# Patient Record
Sex: Female | Born: 1960
Health system: Southern US, Community
[De-identification: ages and names within clinical notes are randomized; demographics above are authoritative.]

## PROBLEM LIST (undated history)

## (undated) DIAGNOSIS — I10 Essential (primary) hypertension: Secondary | ICD-10-CM

## (undated) DIAGNOSIS — E669 Obesity, unspecified: Secondary | ICD-10-CM

## (undated) DIAGNOSIS — Z8619 Personal history of other infectious and parasitic diseases: Secondary | ICD-10-CM

## (undated) DIAGNOSIS — D649 Anemia, unspecified: Secondary | ICD-10-CM

## (undated) DIAGNOSIS — E785 Hyperlipidemia, unspecified: Secondary | ICD-10-CM

## (undated) HISTORY — DX: Personal history of other infectious and parasitic diseases: Z86.19

## (undated) HISTORY — PX: OTHER SURGICAL HISTORY: SHX169

## (undated) HISTORY — DX: Obesity, unspecified: E66.9

## (undated) HISTORY — DX: Anemia, unspecified: D64.9

## (undated) HISTORY — DX: Hyperlipidemia, unspecified: E78.5

## (undated) HISTORY — PX: COLONOSCOPY: SHX174

## (undated) HISTORY — DX: Essential (primary) hypertension: I10

---

## 2002-06-12 ENCOUNTER — Ambulatory Visit (HOSPITAL_COMMUNITY): Admission: RE | Admit: 2002-06-12 | Discharge: 2002-06-12 | Payer: Self-pay | Admitting: *Deleted

## 2003-06-15 ENCOUNTER — Ambulatory Visit (HOSPITAL_COMMUNITY): Admission: RE | Admit: 2003-06-15 | Discharge: 2003-06-15 | Payer: Self-pay | Admitting: *Deleted

## 2003-06-15 ENCOUNTER — Encounter: Payer: Self-pay | Admitting: *Deleted

## 2004-06-16 ENCOUNTER — Ambulatory Visit (HOSPITAL_COMMUNITY): Admission: RE | Admit: 2004-06-16 | Discharge: 2004-06-16 | Payer: Self-pay | Admitting: Family Medicine

## 2004-08-12 ENCOUNTER — Ambulatory Visit: Payer: Self-pay | Admitting: *Deleted

## 2005-03-14 ENCOUNTER — Ambulatory Visit: Payer: Self-pay | Admitting: Family Medicine

## 2005-04-26 ENCOUNTER — Ambulatory Visit: Payer: Self-pay | Admitting: Family Medicine

## 2005-06-19 ENCOUNTER — Ambulatory Visit (HOSPITAL_COMMUNITY): Admission: RE | Admit: 2005-06-19 | Discharge: 2005-06-19 | Payer: Self-pay | Admitting: Family Medicine

## 2005-06-29 ENCOUNTER — Encounter: Admission: RE | Admit: 2005-06-29 | Discharge: 2005-06-29 | Payer: Self-pay | Admitting: Family Medicine

## 2005-09-11 ENCOUNTER — Ambulatory Visit: Payer: Self-pay | Admitting: Family Medicine

## 2006-06-06 ENCOUNTER — Ambulatory Visit: Payer: Self-pay | Admitting: Family Medicine

## 2006-07-06 ENCOUNTER — Ambulatory Visit (HOSPITAL_COMMUNITY): Admission: RE | Admit: 2006-07-06 | Discharge: 2006-07-06 | Payer: Self-pay | Admitting: Family Medicine

## 2007-07-10 ENCOUNTER — Ambulatory Visit (HOSPITAL_COMMUNITY): Admission: RE | Admit: 2007-07-10 | Discharge: 2007-07-10 | Payer: Self-pay | Admitting: Family Medicine

## 2007-08-07 ENCOUNTER — Encounter (INDEPENDENT_AMBULATORY_CARE_PROVIDER_SITE_OTHER): Payer: Self-pay | Admitting: *Deleted

## 2007-09-17 ENCOUNTER — Ambulatory Visit: Payer: Self-pay | Admitting: Internal Medicine

## 2007-09-17 ENCOUNTER — Encounter: Payer: Self-pay | Admitting: Family Medicine

## 2007-09-17 LAB — CONVERTED CEMR LAB
BUN: 15 mg/dL (ref 6–23)
Basophils Relative: 0 % (ref 0–1)
CO2: 21 meq/L (ref 19–32)
Calcium: 10 mg/dL (ref 8.4–10.5)
Chlamydia, DNA Probe: NEGATIVE
Chloride: 108 meq/L (ref 96–112)
Cholesterol: 207 mg/dL — ABNORMAL HIGH (ref 0–200)
Creatinine, Ser: 0.89 mg/dL (ref 0.40–1.20)
Eosinophils Relative: 1 % (ref 0–5)
HCT: 33.5 % — ABNORMAL LOW (ref 36.0–46.0)
HDL: 72 mg/dL (ref >39)
Hemoglobin: 10.6 g/dL — ABNORMAL LOW (ref 12.0–15.0)
MCHC: 31.6 g/dL (ref 30.0–36.0)
Monocytes Absolute: 0.3 10*3/uL (ref 0.2–0.7)
Monocytes Relative: 6 % (ref 3–11)
Neutro Abs: 2.8 10*3/uL (ref 1.7–7.7)
RBC: 3.75 M/uL — ABNORMAL LOW (ref 3.87–5.11)
RDW: 12.9 % (ref 11.5–14.0)
Total Bilirubin: 0.5 mg/dL (ref 0.3–1.2)
Total CHOL/HDL Ratio: 2.9
VLDL: 19 mg/dL (ref 0–40)

## 2007-09-23 ENCOUNTER — Ambulatory Visit: Payer: Self-pay | Admitting: Internal Medicine

## 2007-11-08 ENCOUNTER — Ambulatory Visit: Payer: Self-pay | Admitting: Internal Medicine

## 2007-12-30 ENCOUNTER — Ambulatory Visit: Payer: Self-pay | Admitting: Family Medicine

## 2007-12-30 ENCOUNTER — Encounter (INDEPENDENT_AMBULATORY_CARE_PROVIDER_SITE_OTHER): Payer: Self-pay | Admitting: Internal Medicine

## 2007-12-30 LAB — CONVERTED CEMR LAB
ALT: 12 units/L (ref 0–35)
AST: 13 units/L (ref 0–37)
Alkaline Phosphatase: 48 units/L (ref 39–117)
Basophils Absolute: 0 10*3/uL (ref 0.0–0.1)
Basophils Relative: 0 % (ref 0–1)
Eosinophils Absolute: 0.1 10*3/uL (ref 0.0–0.7)
Eosinophils Relative: 1 % (ref 0–5)
Glucose, Bld: 88 mg/dL (ref 70–99)
HCT: 35.7 % — ABNORMAL LOW (ref 36.0–46.0)
LDL Cholesterol: 60 mg/dL (ref 0–99)
MCHC: 30.3 g/dL (ref 30.0–36.0)
MCV: 92.2 fL (ref 78.0–100.0)
Platelets: 273 10*3/uL (ref 150–400)
RDW: 13.7 % (ref 11.5–15.5)
Sodium: 139 meq/L (ref 135–145)
Total Bilirubin: 0.5 mg/dL (ref 0.3–1.2)
Total Protein: 7.9 g/dL (ref 6.0–8.3)
Triglycerides: 141 mg/dL (ref <150)
VLDL: 28 mg/dL (ref 0–40)

## 2008-03-16 ENCOUNTER — Ambulatory Visit: Payer: Self-pay | Admitting: Family Medicine

## 2008-07-21 ENCOUNTER — Ambulatory Visit (HOSPITAL_COMMUNITY): Admission: RE | Admit: 2008-07-21 | Discharge: 2008-07-21 | Payer: Self-pay | Admitting: Family Medicine

## 2008-09-14 ENCOUNTER — Encounter (INDEPENDENT_AMBULATORY_CARE_PROVIDER_SITE_OTHER): Payer: Self-pay | Admitting: Adult Health

## 2008-09-14 ENCOUNTER — Ambulatory Visit: Payer: Self-pay | Admitting: Internal Medicine

## 2008-09-14 LAB — CONVERTED CEMR LAB
AST: 11 units/L (ref 0–37)
Albumin: 5.2 g/dL (ref 3.5–5.2)
Alkaline Phosphatase: 46 units/L (ref 39–117)
Basophils Relative: 0 % (ref 0–1)
Chlamydia, DNA Probe: NEGATIVE
Eosinophils Absolute: 0 10*3/uL (ref 0.0–0.7)
LDL Cholesterol: 69 mg/dL (ref 0–99)
Lymphs Abs: 2.5 10*3/uL (ref 0.7–4.0)
MCV: 88 fL (ref 78.0–100.0)
Microalb, Ur: 2.85 mg/dL — ABNORMAL HIGH (ref 0.00–1.89)
Neutrophils Relative %: 50 % (ref 43–77)
Platelets: 260 10*3/uL (ref 150–400)
Potassium: 3.1 meq/L — ABNORMAL LOW (ref 3.5–5.3)
Sodium: 140 meq/L (ref 135–145)
Total Protein: 8.4 g/dL — ABNORMAL HIGH (ref 6.0–8.3)
WBC: 5.7 10*3/uL (ref 4.0–10.5)

## 2008-09-15 ENCOUNTER — Encounter (INDEPENDENT_AMBULATORY_CARE_PROVIDER_SITE_OTHER): Payer: Self-pay | Admitting: Adult Health

## 2008-12-18 ENCOUNTER — Ambulatory Visit: Payer: Self-pay | Admitting: Internal Medicine

## 2009-01-08 ENCOUNTER — Ambulatory Visit: Payer: Self-pay | Admitting: Internal Medicine

## 2009-01-08 ENCOUNTER — Encounter (INDEPENDENT_AMBULATORY_CARE_PROVIDER_SITE_OTHER): Payer: Self-pay | Admitting: Adult Health

## 2009-01-08 LAB — CONVERTED CEMR LAB
ALT: 14 units/L (ref 0–35)
Albumin: 4.6 g/dL (ref 3.5–5.2)
Alkaline Phosphatase: 53 units/L (ref 39–117)
CO2: 23 meq/L (ref 19–32)
Eosinophils Absolute: 0.1 10*3/uL (ref 0.0–0.7)
Glucose, Bld: 105 mg/dL — ABNORMAL HIGH (ref 70–99)
LDL Cholesterol: 79 mg/dL (ref 0–99)
Lymphocytes Relative: 50 % — ABNORMAL HIGH (ref 12–46)
Lymphs Abs: 3.2 10*3/uL (ref 0.7–4.0)
Neutrophils Relative %: 41 % — ABNORMAL LOW (ref 43–77)
Platelets: 269 10*3/uL (ref 150–400)
Potassium: 3.7 meq/L (ref 3.5–5.3)
Sodium: 142 meq/L (ref 135–145)
Total Protein: 7.2 g/dL (ref 6.0–8.3)
Triglycerides: 65 mg/dL (ref <150)
WBC: 6.3 10*3/uL (ref 4.0–10.5)

## 2009-07-13 ENCOUNTER — Ambulatory Visit: Payer: Self-pay | Admitting: Internal Medicine

## 2009-07-22 ENCOUNTER — Ambulatory Visit (HOSPITAL_COMMUNITY): Admission: RE | Admit: 2009-07-22 | Discharge: 2009-07-22 | Payer: Self-pay | Admitting: Internal Medicine

## 2009-07-28 ENCOUNTER — Ambulatory Visit: Payer: Self-pay | Admitting: Internal Medicine

## 2009-07-29 ENCOUNTER — Encounter (INDEPENDENT_AMBULATORY_CARE_PROVIDER_SITE_OTHER): Payer: Self-pay | Admitting: *Deleted

## 2009-07-29 LAB — CONVERTED CEMR LAB
ALT: 11 units/L (ref 0–35)
Albumin: 4.3 g/dL (ref 3.5–5.2)
Basophils Relative: 0 % (ref 0–1)
CO2: 24 meq/L (ref 19–32)
Chloride: 105 meq/L (ref 96–112)
Cholesterol: 159 mg/dL (ref 0–200)
Glucose, Bld: 95 mg/dL (ref 70–99)
Hemoglobin: 10.2 g/dL — ABNORMAL LOW (ref 12.0–15.0)
Lymphocytes Relative: 47 % — ABNORMAL HIGH (ref 12–46)
Lymphs Abs: 3.6 10*3/uL (ref 0.7–4.0)
MCHC: 30.3 g/dL (ref 30.0–36.0)
Monocytes Absolute: 0.6 10*3/uL (ref 0.1–1.0)
Monocytes Relative: 8 % (ref 3–12)
Neutro Abs: 3.4 10*3/uL (ref 1.7–7.7)
Potassium: 3.5 meq/L (ref 3.5–5.3)
RBC: 3.69 M/uL — ABNORMAL LOW (ref 3.87–5.11)
Sodium: 142 meq/L (ref 135–145)
Total Protein: 7.3 g/dL (ref 6.0–8.3)
Triglycerides: 111 mg/dL (ref <150)
WBC: 7.7 10*3/uL (ref 4.0–10.5)

## 2009-08-02 ENCOUNTER — Encounter (INDEPENDENT_AMBULATORY_CARE_PROVIDER_SITE_OTHER): Payer: Self-pay | Admitting: Adult Health

## 2009-08-02 LAB — CONVERTED CEMR LAB
Folate: 9.6 ng/mL
Iron: 97 ug/dL (ref 42–145)
TIBC: 340 ug/dL (ref 250–470)
UIBC: 243 ug/dL
Vitamin B-12: 403 pg/mL (ref 211–911)

## 2009-10-01 ENCOUNTER — Ambulatory Visit: Payer: Self-pay | Admitting: Internal Medicine

## 2009-10-01 ENCOUNTER — Encounter (INDEPENDENT_AMBULATORY_CARE_PROVIDER_SITE_OTHER): Payer: Self-pay | Admitting: Adult Health

## 2009-10-01 LAB — CONVERTED CEMR LAB
Albumin: 5 g/dL (ref 3.5–5.2)
BUN: 22 mg/dL (ref 6–23)
Calcium: 10.3 mg/dL (ref 8.4–10.5)
Chloride: 103 meq/L (ref 96–112)
Creatinine, Ser: 1.1 mg/dL (ref 0.40–1.20)
Eosinophils Absolute: 0 10*3/uL (ref 0.0–0.7)
Eosinophils Relative: 1 % (ref 0–5)
Glucose, Bld: 102 mg/dL — ABNORMAL HIGH (ref 70–99)
HCT: 34.4 % — ABNORMAL LOW (ref 36.0–46.0)
Hemoglobin: 10.5 g/dL — ABNORMAL LOW (ref 12.0–15.0)
Lymphocytes Relative: 39 % (ref 12–46)
Lymphs Abs: 2.5 10*3/uL (ref 0.7–4.0)
MCV: 91.2 fL (ref 78.0–100.0)
Monocytes Absolute: 0.6 10*3/uL (ref 0.1–1.0)
Platelets: 227 10*3/uL (ref 150–400)
Potassium: 3.5 meq/L (ref 3.5–5.3)
RDW: 13.1 % (ref 11.5–15.5)
Vit D, 25-Hydroxy: 7 ng/mL — ABNORMAL LOW (ref 30–89)
WBC: 6.3 10*3/uL (ref 4.0–10.5)

## 2009-10-12 ENCOUNTER — Ambulatory Visit: Payer: Self-pay | Admitting: Internal Medicine

## 2010-01-11 ENCOUNTER — Ambulatory Visit: Payer: Self-pay | Admitting: Internal Medicine

## 2010-02-14 ENCOUNTER — Ambulatory Visit: Payer: Self-pay | Admitting: Internal Medicine

## 2010-04-07 ENCOUNTER — Encounter (INDEPENDENT_AMBULATORY_CARE_PROVIDER_SITE_OTHER): Payer: Self-pay | Admitting: Adult Health

## 2010-04-07 ENCOUNTER — Ambulatory Visit: Payer: Self-pay | Admitting: Internal Medicine

## 2010-04-07 LAB — CONVERTED CEMR LAB
Albumin: 4.8 g/dL (ref 3.5–5.2)
Alkaline Phosphatase: 49 units/L (ref 39–117)
BUN: 28 mg/dL — ABNORMAL HIGH (ref 6–23)
Basophils Absolute: 0 10*3/uL (ref 0.0–0.1)
CO2: 21 meq/L (ref 19–32)
Calcium: 9.6 mg/dL (ref 8.4–10.5)
Chloride: 103 meq/L (ref 96–112)
Eosinophils Relative: 1 % (ref 0–5)
Glucose, Bld: 96 mg/dL (ref 70–99)
HCT: 35.5 % — ABNORMAL LOW (ref 36.0–46.0)
Hemoglobin: 10.9 g/dL — ABNORMAL LOW (ref 12.0–15.0)
LDL Cholesterol: 72 mg/dL (ref 0–99)
Lymphocytes Relative: 44 % (ref 12–46)
Lymphs Abs: 3.2 10*3/uL (ref 0.7–4.0)
Monocytes Absolute: 0.6 10*3/uL (ref 0.1–1.0)
Monocytes Relative: 8 % (ref 3–12)
Neutro Abs: 3.4 10*3/uL (ref 1.7–7.7)
Potassium: 3.3 meq/L — ABNORMAL LOW (ref 3.5–5.3)
RBC: 3.91 M/uL (ref 3.87–5.11)
Sodium: 139 meq/L (ref 135–145)
Total Protein: 7.9 g/dL (ref 6.0–8.3)
Triglycerides: 98 mg/dL (ref <150)
WBC: 7.3 10*3/uL (ref 4.0–10.5)

## 2010-06-14 ENCOUNTER — Ambulatory Visit: Payer: Self-pay | Admitting: Internal Medicine

## 2010-06-14 ENCOUNTER — Encounter (INDEPENDENT_AMBULATORY_CARE_PROVIDER_SITE_OTHER): Payer: Self-pay | Admitting: Adult Health

## 2010-06-14 LAB — CONVERTED CEMR LAB
BUN: 22 mg/dL (ref 6–23)
CO2: 22 meq/L (ref 19–32)
Chloride: 103 meq/L (ref 96–112)
Creatinine, Ser: 1.18 mg/dL (ref 0.40–1.20)
Eosinophils Relative: 1 % (ref 0–5)
HCT: 34.4 % — ABNORMAL LOW (ref 36.0–46.0)
Hemoglobin: 10.9 g/dL — ABNORMAL LOW (ref 12.0–15.0)
Lymphocytes Relative: 45 % (ref 12–46)
MCHC: 31.7 g/dL (ref 30.0–36.0)
Monocytes Absolute: 0.4 10*3/uL (ref 0.1–1.0)
Monocytes Relative: 6 % (ref 3–12)
Neutro Abs: 3.2 10*3/uL (ref 1.7–7.7)
RBC: 3.87 M/uL (ref 3.87–5.11)
Vit D, 25-Hydroxy: 23 ng/mL — ABNORMAL LOW (ref 30–89)

## 2010-06-20 ENCOUNTER — Encounter (INDEPENDENT_AMBULATORY_CARE_PROVIDER_SITE_OTHER): Payer: Self-pay | Admitting: Adult Health

## 2010-07-26 ENCOUNTER — Ambulatory Visit (HOSPITAL_COMMUNITY): Admission: RE | Admit: 2010-07-26 | Discharge: 2010-07-26 | Payer: Self-pay | Admitting: Internal Medicine

## 2010-08-02 ENCOUNTER — Encounter: Admission: RE | Admit: 2010-08-02 | Discharge: 2010-08-02 | Payer: Self-pay | Admitting: Internal Medicine

## 2011-07-03 ENCOUNTER — Other Ambulatory Visit (HOSPITAL_COMMUNITY): Payer: Self-pay | Admitting: Family Medicine

## 2011-07-03 DIAGNOSIS — Z1231 Encounter for screening mammogram for malignant neoplasm of breast: Secondary | ICD-10-CM

## 2011-08-04 ENCOUNTER — Ambulatory Visit (HOSPITAL_COMMUNITY)
Admission: RE | Admit: 2011-08-04 | Discharge: 2011-08-04 | Disposition: A | Discharge status: Home / Self Care | Payer: Self-pay | Source: Ambulatory Visit | Attending: Family Medicine | Admitting: Family Medicine

## 2011-08-04 DIAGNOSIS — Z1231 Encounter for screening mammogram for malignant neoplasm of breast: Secondary | ICD-10-CM | POA: Insufficient documentation

## 2012-04-19 ENCOUNTER — Telehealth: Payer: Self-pay | Admitting: Internal Medicine

## 2012-04-19 NOTE — Telephone Encounter (Signed)
s/w pt and she is aware of her appt   aom  °

## 2012-04-22 ENCOUNTER — Telehealth: Payer: Self-pay | Admitting: Internal Medicine

## 2012-04-22 NOTE — Telephone Encounter (Signed)
Referred by Dr. Norberto Sorenson Dx- Chronic Anemia

## 2012-04-26 ENCOUNTER — Encounter: Payer: Self-pay | Admitting: Medical Oncology

## 2012-04-29 ENCOUNTER — Other Ambulatory Visit (HOSPITAL_BASED_OUTPATIENT_CLINIC_OR_DEPARTMENT_OTHER): Payer: Self-pay | Admitting: Lab

## 2012-04-29 ENCOUNTER — Ambulatory Visit (HOSPITAL_BASED_OUTPATIENT_CLINIC_OR_DEPARTMENT_OTHER): Payer: Self-pay | Admitting: Internal Medicine

## 2012-04-29 ENCOUNTER — Telehealth: Payer: Self-pay | Admitting: Internal Medicine

## 2012-04-29 ENCOUNTER — Encounter: Payer: Self-pay | Admitting: Internal Medicine

## 2012-04-29 ENCOUNTER — Ambulatory Visit: Payer: Self-pay

## 2012-04-29 VITALS — BP 173/69 | HR 92 | Temp 97.4°F | Ht 64.5 in | Wt 164.9 lb

## 2012-04-29 DIAGNOSIS — E785 Hyperlipidemia, unspecified: Secondary | ICD-10-CM

## 2012-04-29 DIAGNOSIS — D649 Anemia, unspecified: Secondary | ICD-10-CM

## 2012-04-29 DIAGNOSIS — E669 Obesity, unspecified: Secondary | ICD-10-CM

## 2012-04-29 DIAGNOSIS — I10 Essential (primary) hypertension: Secondary | ICD-10-CM

## 2012-04-29 LAB — CBC & DIFF AND RETIC
Eosinophils Absolute: 0 10*3/uL (ref 0.0–0.5)
LYMPH%: 50.1 % — ABNORMAL HIGH (ref 14.0–49.7)
MCHC: 32.5 g/dL (ref 31.5–36.0)
MCV: 88.6 fL (ref 79.5–101.0)
MONO%: 5.3 % (ref 0.0–14.0)
NEUT#: 2.8 10*3/uL (ref 1.5–6.5)
NEUT%: 43.8 % (ref 38.4–76.8)
Platelets: 171 10*3/uL (ref 145–400)
RBC: 3.51 10*6/uL — ABNORMAL LOW (ref 3.70–5.45)
Retic %: 2.59 % — ABNORMAL HIGH (ref 0.70–2.10)

## 2012-04-29 NOTE — Telephone Encounter (Signed)
Gv pt appt for july2013 °

## 2012-04-29 NOTE — Progress Notes (Signed)
Patient came in today as a new patient she has no insurance she has an orange card through [place].I gave her an Medicaid application and Epp application to fill out and return to me.

## 2012-04-29 NOTE — Progress Notes (Signed)
[facility] [phone]   [phone]  CONSULT NOTE  REASON FOR CONSULTATION:  51 years old [place] American female with persistent anemia.  HPI Anita Ramirez is a 51 y.o. female was past medical history significant for hypertension, dyslipidemia and persistent anemia for years. The patient was seen recently by her primary care physician at the [facility]. Blood work at that time showed persistent anemia, most likely secondary to iron deficient. The patient had hemoglobin electrophoresis performed in August of 2011 that showed normal pattern. She has been on iron supplement with Fersol 325 mg by mouth daily. She is tolerating it fine with no specific adverse effects. The patient denied having any complaints today he is specifically no fatigue or dizzy spells. The patient denied having any bleeding issues, no bruises or ecchymosis. Her menstrual cycles are regular and lasting around 5 days with no clots. She was referred to me today for further evaluation and recommendation regarding her persistent anemia. The patient has no family history of sickle cell disease or other type of anemia. She is single and has no children. @SFHPI @  Past Medical History  Diagnosis Date  . Hypertension   . Obesity   . Hyperlipemia   . Anemia     No past surgical history on file.  No family history on file.  Social History History  Substance Use Topics  . Smoking status: Not on file  . Smokeless tobacco: Not on file  . Alcohol Use: Not on file    Allergies no known allergies  Current Outpatient Prescriptions  Medication Sig Dispense Refill  . atenolol (TENORMIN) 25 MG tablet Take 25 mg by mouth daily.      . ferrous sulfate 325 (65 FE) MG tablet Take 325 mg by mouth 2 (two) times daily.      Marland Kitchen lisinopril-hydrochlorothiazide (PRINZIDE,ZESTORETIC) 20-25 MG per tablet Take 1 tablet by mouth daily.      . pravastatin (PRAVACHOL) 40 MG tablet Take 40 mg by  mouth at bedtime.        Review of Systems  A comprehensive review of systems was negative.  Physical Exam  GNF:AOZHY, healthy, no distress, well nourished and well developed SKIN: skin color, texture, turgor are normal HEAD: Normocephalic, No masses, lesions, tenderness or abnormalities EYES: normal EARS: External ears normal OROPHARYNX:no exudate and no erythema  NECK: supple, no adenopathy LYMPH:  no palpable lymphadenopathy, no hepatosplenomegaly BREAST:not examined LUNGS: clear to auscultation  HEART: regular rate & rhythm, no murmurs and no gallops ABDOMEN:abdomen soft, non-tender, normal bowel sounds and no masses or organomegaly BACK: Back symmetric, no curvature. EXTREMITIES:no joint deformities, effusion, or inflammation, no edema, no skin discoloration, no clubbing, no cyanosis  NEURO: alert & oriented x 3 with fluent speech, no focal motor/sensory deficits, gait normal  PERFORMANCE STATUS: ECOG 0  Studies/Results: No results found.   ASSESSMENT: This is a very pleasant 51 years old [place] American female with persistent anemia most likely secondary to iron deficiency.  PLAN: I have a lengthy discussion with the patient today about her current disease and evaluation for her persistent anemia. I recommended several studies today including repeat CBC, comprehensive metabolic panel, LDH, iron study, ferritin, serum B12, serum folate and serum protein electrophoreses. These studies are still pending. I recommended for the patient to continue on oral iron supplement but to take it twice a day. I would see the patient back for followup visit in 4 weeks for evaluation and discussion of her lab  results and further recommendations regarding treatment of her condition. If no response to the oral iron tablet, I may consider the patient for intravenous iron infusion. The patient agreed to the current plan.  All questions were answered. The patient knows to call the clinic  with any problems, questions or concerns. We can certainly see the patient much sooner if necessary.  Thank you so much for allowing me to participate in the care of Anita Ramirez. I will continue to follow up the patient with you and assist in her care.  I spent 25 minutes counseling the patient face to face. The total time spent in the appointment was 50 minutes.  Leocadia Idleman K. 04/29/2012, 10:48 AM

## 2012-05-01 LAB — PROTEIN ELECTROPHORESIS, SERUM
Albumin ELP: 61.6 % (ref 55.8–66.1)
Alpha-1-Globulin: 4 % (ref 2.9–4.9)
Alpha-2-Globulin: 9.6 % (ref 7.1–11.8)
Beta 2: 4.8 % (ref 3.2–6.5)
Beta Globulin: 6 % (ref 4.7–7.2)
Gamma Globulin: 14 % (ref 11.1–18.8)
Total Protein, Serum Electrophoresis: 6.5 g/dL (ref 6.0–8.3)

## 2012-05-01 LAB — IRON AND TIBC
%SAT: 44 % (ref 20–55)
Iron: 142 ug/dL (ref 42–145)
TIBC: 326 ug/dL (ref 250–470)
UIBC: 184 ug/dL (ref 125–400)

## 2012-05-01 LAB — FERRITIN: Ferritin: 1228 ng/mL — ABNORMAL HIGH (ref 10–291)

## 2012-05-01 LAB — COMPREHENSIVE METABOLIC PANEL
ALT: 14 U/L (ref 0–35)
AST: 14 U/L (ref 0–37)
Albumin: 4.1 g/dL (ref 3.5–5.2)
Alkaline Phosphatase: 36 U/L — ABNORMAL LOW (ref 39–117)
BUN: 14 mg/dL (ref 6–23)
Calcium: 9.5 mg/dL (ref 8.4–10.5)
Chloride: 112 mEq/L (ref 96–112)
Creatinine, Ser: 1.06 mg/dL (ref 0.50–1.10)
Potassium: 3.8 mEq/L (ref 3.5–5.3)

## 2012-05-01 LAB — ERYTHROPOIETIN: Erythropoietin: 46 m[IU]/mL — ABNORMAL HIGH (ref 2.6–34.0)

## 2012-05-01 LAB — FOLATE: Folate: 7.5 ng/mL

## 2012-05-27 ENCOUNTER — Other Ambulatory Visit (HOSPITAL_BASED_OUTPATIENT_CLINIC_OR_DEPARTMENT_OTHER): Payer: Self-pay | Admitting: Lab

## 2012-05-27 ENCOUNTER — Ambulatory Visit (HOSPITAL_BASED_OUTPATIENT_CLINIC_OR_DEPARTMENT_OTHER): Payer: Self-pay | Admitting: Internal Medicine

## 2012-05-27 ENCOUNTER — Encounter: Payer: Self-pay | Admitting: Internal Medicine

## 2012-05-27 ENCOUNTER — Telehealth: Payer: Self-pay | Admitting: Internal Medicine

## 2012-05-27 VITALS — BP 134/85 | HR 91 | Temp 99.0°F | Ht 64.5 in | Wt 159.7 lb

## 2012-05-27 DIAGNOSIS — D509 Iron deficiency anemia, unspecified: Secondary | ICD-10-CM

## 2012-05-27 DIAGNOSIS — D649 Anemia, unspecified: Secondary | ICD-10-CM

## 2012-05-27 LAB — CBC WITH DIFFERENTIAL/PLATELET
Basophils Absolute: 0 10*3/uL (ref 0.0–0.1)
Eosinophils Absolute: 0 10*3/uL (ref 0.0–0.5)
HCT: 33.7 % — ABNORMAL LOW (ref 34.8–46.6)
LYMPH%: 46.5 % (ref 14.0–49.7)
MCV: 90 fL (ref 79.5–101.0)
MONO%: 8 % (ref 0.0–14.0)
NEUT#: 2.9 10*3/uL (ref 1.5–6.5)
NEUT%: 44.6 % (ref 38.4–76.8)
Platelets: 205 10*3/uL (ref 145–400)
RBC: 3.75 10*6/uL (ref 3.70–5.45)

## 2012-05-27 NOTE — Progress Notes (Signed)
Patient stop by my office today after her doctor visit to return her epp application and the medicaid application.I gave her the medicaid application back ,because she was missing her Id,Social security Card,Birth Certificate,and proof of Residency.I told her once she get all of that information together to take it down there to [employer] Social Service,her boy friend say he know where its located.

## 2012-05-27 NOTE — Progress Notes (Signed)
[facility] [phone]   Fax:([phone]  OFFICE PROGRESS NOTE   DIAGNOSIS: Iron deficiency anemia  PRIOR THERAPY: None  CURRENT THERAPY: Ferrous sulfate 325 mg by mouth twice a day   INTERVAL HISTORY: Anita Ramirez 51 y.o. female returns to the clinic today for followup visit accompanied by her husband. The patient is doing fine today with no specific complaints. She denied having any fatigue or weakness. No dizzy spells. She is tolerating her treatment with oral iron tablet well. The patient has several studies for evaluation of her anemia including erythropoietin levels that was 46.0, iron was normal at 142, total iron binding capacity 326 with iron saturation of 44% and ferritin level of 1228. Serum folate was normal at 7.5, serum vitamin B12 was normal at 213 and serum protein electrophoreses showed no detectable M spike.   MEDICAL HISTORY: Past Medical History  Diagnosis Date  . Hypertension   . Obesity   . Hyperlipemia   . Anemia     ALLERGIES:   has no known allergies.  MEDICATIONS:  Current Outpatient Prescriptions  Medication Sig Dispense Refill  . atenolol (TENORMIN) 25 MG tablet Take 25 mg by mouth daily.      . ferrous sulfate 325 (65 FE) MG tablet Take 325 mg by mouth 2 (two) times daily.      Marland Kitchen lisinopril-hydrochlorothiazide (PRINZIDE,ZESTORETIC) 20-25 MG per tablet Take 1 tablet by mouth daily.        REVIEW OF SYSTEMS:  A comprehensive review of systems was negative.   PHYSICAL EXAMINATION: General appearance: alert, cooperative and no distress Neck: no adenopathy Lymph nodes: Cervical, supraclavicular, and axillary nodes normal. Resp: clear to auscultation bilaterally Cardio: regular rate and rhythm, S1, S2 normal, no murmur, click, rub or gallop GI: soft, non-tender; bowel sounds normal; no masses,  no organomegaly Extremities: extremities normal, atraumatic, no cyanosis or edema  ECOG PERFORMANCE STATUS: 0 -  Asymptomatic  Blood pressure 134/85, pulse 91, temperature 99 F (37.2 C), temperature source Oral, height 5' 4.5" (1.638 m), weight 159 lb 11.2 oz (72.439 kg).  LABORATORY DATA: Lab Results  Component Value Date   WBC 6.5 05/27/2012   HGB 11.0* 05/27/2012   HCT 33.7* 05/27/2012   MCV 90.0 05/27/2012   PLT 205 05/27/2012      Chemistry      Component Value Date/Time   NA 145 04/29/2012 0938   K 3.8 04/29/2012 0938   CL 112 04/29/2012 0938   CO2 24 04/29/2012 0938   BUN 14 04/29/2012 0938   CREATININE 1.06 04/29/2012 0938      Component Value Date/Time   CALCIUM 9.5 04/29/2012 0938   ALKPHOS 36* 04/29/2012 0938   AST 14 04/29/2012 0938   ALT 14 04/29/2012 0938   BILITOT 0.4 04/29/2012 0938       RADIOGRAPHIC STUDIES: No results found.  ASSESSMENT: This is a very pleasant 51 years old [place] American female with iron deficiency anemia currently on oral iron tablets. The patient is doing fine and there is improvement in her hemoglobin and hematocrit since her last visit.  PLAN: I discussed the lab result with the patient and her husband and recommend for her to continue his oral iron tablet for now. I would see her back for followup visit in 3 months with repeat CBC, iron study and ferritin. She was advised to call me immediately if she has any concerning symptoms in the interval.  All questions were answered. The  patient knows to call the clinic with any problems, questions or concerns. We can certainly see the patient much sooner if necessary.

## 2012-05-27 NOTE — Telephone Encounter (Signed)
appts made and printed for pt aom °

## 2012-06-03 ENCOUNTER — Encounter: Payer: Self-pay | Admitting: Internal Medicine

## 2012-06-03 NOTE — Progress Notes (Signed)
Patient approve for a 100% Discount for a family of one,income 3,480.00  06/03/12 - 12/04/12.

## 2012-08-13 ENCOUNTER — Ambulatory Visit (HOSPITAL_COMMUNITY): Payer: Self-pay

## 2012-08-20 ENCOUNTER — Other Ambulatory Visit: Payer: Self-pay | Admitting: Obstetrics and Gynecology

## 2012-08-20 DIAGNOSIS — Z1231 Encounter for screening mammogram for malignant neoplasm of breast: Secondary | ICD-10-CM

## 2012-08-21 ENCOUNTER — Encounter (HOSPITAL_COMMUNITY): Payer: Self-pay | Admitting: *Deleted

## 2012-08-27 ENCOUNTER — Ambulatory Visit (HOSPITAL_COMMUNITY)
Admission: RE | Admit: 2012-08-27 | Discharge: 2012-08-27 | Disposition: A | Discharge status: Home / Self Care | Payer: Self-pay | Source: Ambulatory Visit | Attending: Obstetrics and Gynecology | Admitting: Obstetrics and Gynecology

## 2012-08-27 ENCOUNTER — Other Ambulatory Visit: Payer: Self-pay | Admitting: Obstetrics and Gynecology

## 2012-08-27 ENCOUNTER — Other Ambulatory Visit (HOSPITAL_COMMUNITY)
Admission: RE | Admit: 2012-08-27 | Discharge: 2012-08-27 | Disposition: A | Discharge status: Home / Self Care | Payer: Self-pay | Source: Ambulatory Visit | Attending: Obstetrics and Gynecology | Admitting: Obstetrics and Gynecology

## 2012-08-27 ENCOUNTER — Encounter (HOSPITAL_COMMUNITY): Payer: Self-pay

## 2012-08-27 ENCOUNTER — Ambulatory Visit: Payer: Self-pay

## 2012-08-27 DIAGNOSIS — Z1231 Encounter for screening mammogram for malignant neoplasm of breast: Secondary | ICD-10-CM

## 2012-08-27 DIAGNOSIS — Z01419 Encounter for gynecological examination (general) (routine) without abnormal findings: Secondary | ICD-10-CM | POA: Insufficient documentation

## 2012-08-27 LAB — POCT PREGNANCY, URINE: Preg Test, Ur: NEGATIVE

## 2012-08-27 LAB — HM PAP SMEAR

## 2012-08-28 ENCOUNTER — Ambulatory Visit: Payer: Self-pay | Admitting: Internal Medicine

## 2012-08-28 ENCOUNTER — Other Ambulatory Visit: Payer: Self-pay | Admitting: Lab

## 2012-09-09 NOTE — Progress Notes (Signed)
Detailed notes scanned into EPIC under media. 

## 2012-09-09 NOTE — Patient Instructions (Signed)
Detailed notes scanned into EPIC under media. 

## 2012-09-12 ENCOUNTER — Telehealth (HOSPITAL_COMMUNITY): Payer: Self-pay | Admitting: *Deleted

## 2012-09-12 NOTE — Telephone Encounter (Signed)
Telephoned patient and left message to return call to [facility] 

## 2012-09-20 ENCOUNTER — Other Ambulatory Visit: Payer: Self-pay | Admitting: *Deleted

## 2012-09-20 ENCOUNTER — Telehealth (HOSPITAL_COMMUNITY): Payer: Self-pay | Admitting: *Deleted

## 2012-09-20 DIAGNOSIS — A599 Trichomoniasis, unspecified: Secondary | ICD-10-CM

## 2012-09-20 MED ORDER — METRONIDAZOLE 250 MG PO TABS: 500.0000 mg | ORAL_TABLET | Freq: Two times a day (BID) | ORAL | Status: DC

## 2012-09-20 NOTE — Telephone Encounter (Signed)
Telephoned patient at home # and discussed results of pap smear and mammogram. Advised patient that pap smear showed trichomonas and that she needed to be treated. Med called in to [facility] on [address]. Patient voiced understanding.

## 2012-09-22 IMAGING — MG MM DIGITAL SCREENING {WH}
5 series · 5 of 5 positions shown · non-contrast
Comparison: none

DG SCREEN MAMMOGRAM BILATERAL
Bilateral CC and MLO view(s) were taken.
Technologist: Madh, Aligert.(JHULIANO)(M)

DIGITAL SCREENING MAMMOGRAM WITH CAD:
The breast tissue is heterogeneously dense.  No masses or malignant type calcifications are 
identified.  Compared with prior studies.
Images were processed with CAD.

[R CC]
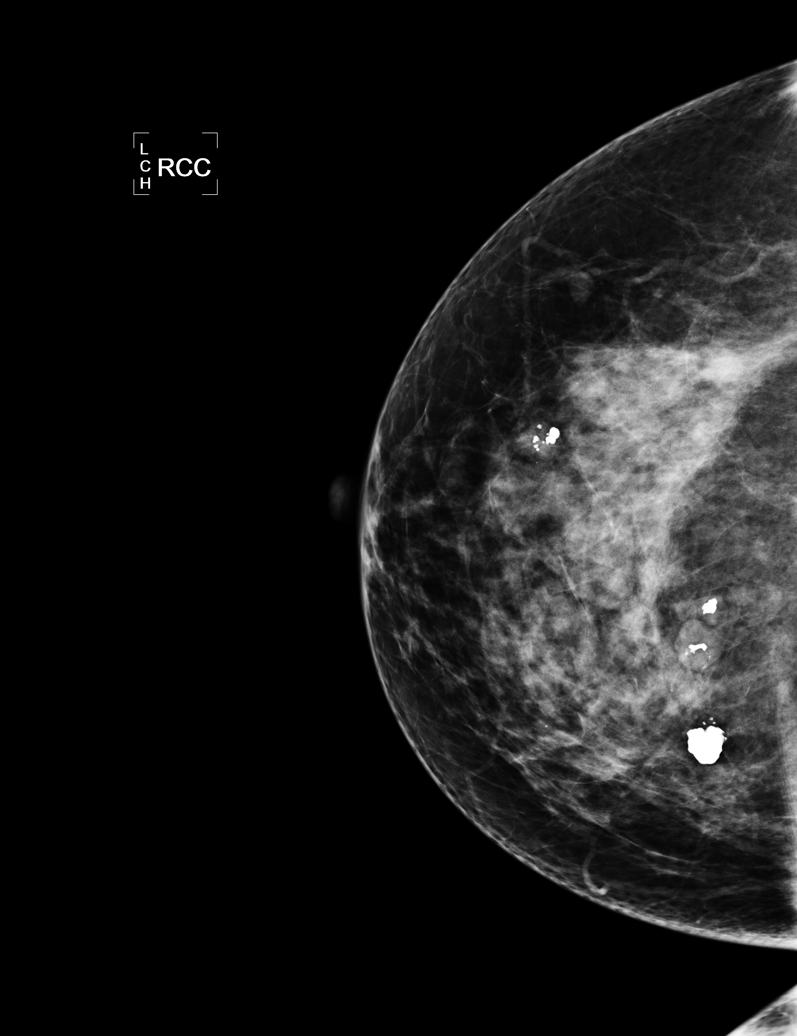

[R MLO (1 of 2)]
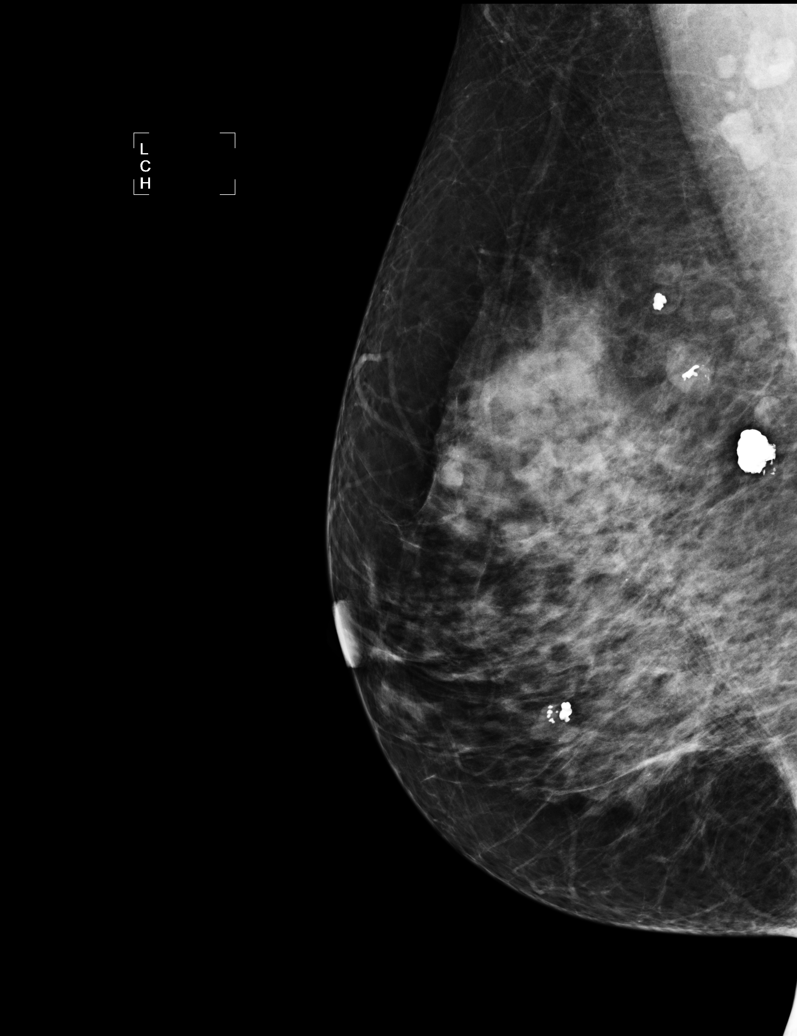

[L CC]
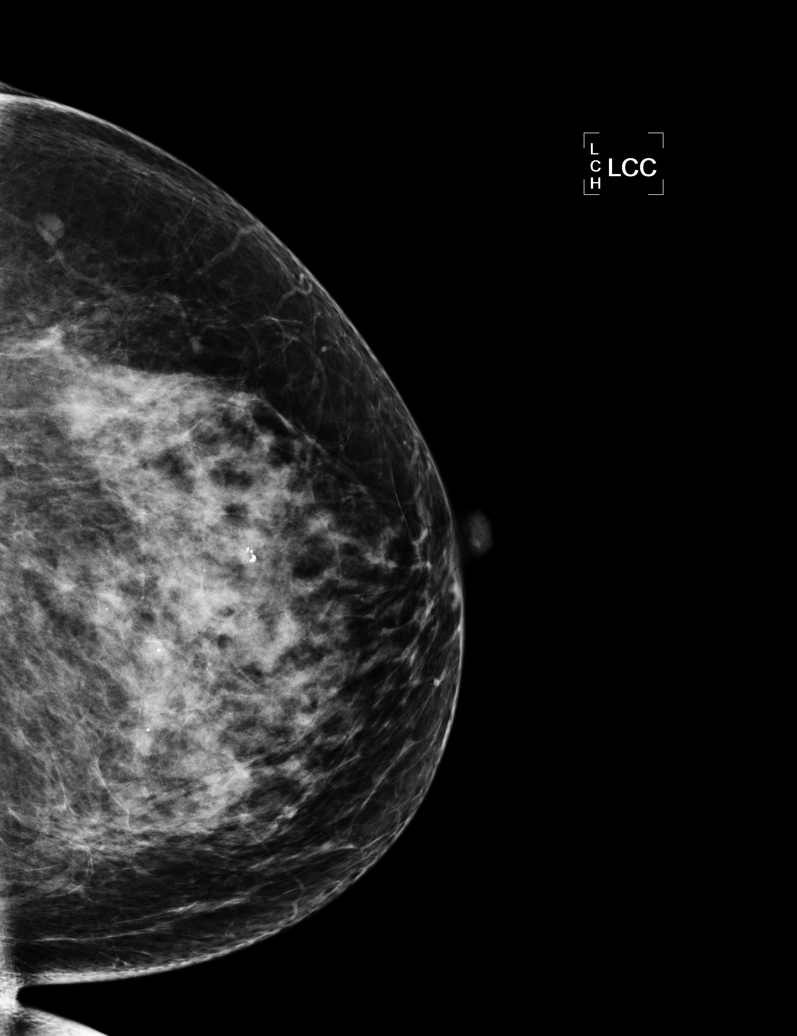

[L MLO]
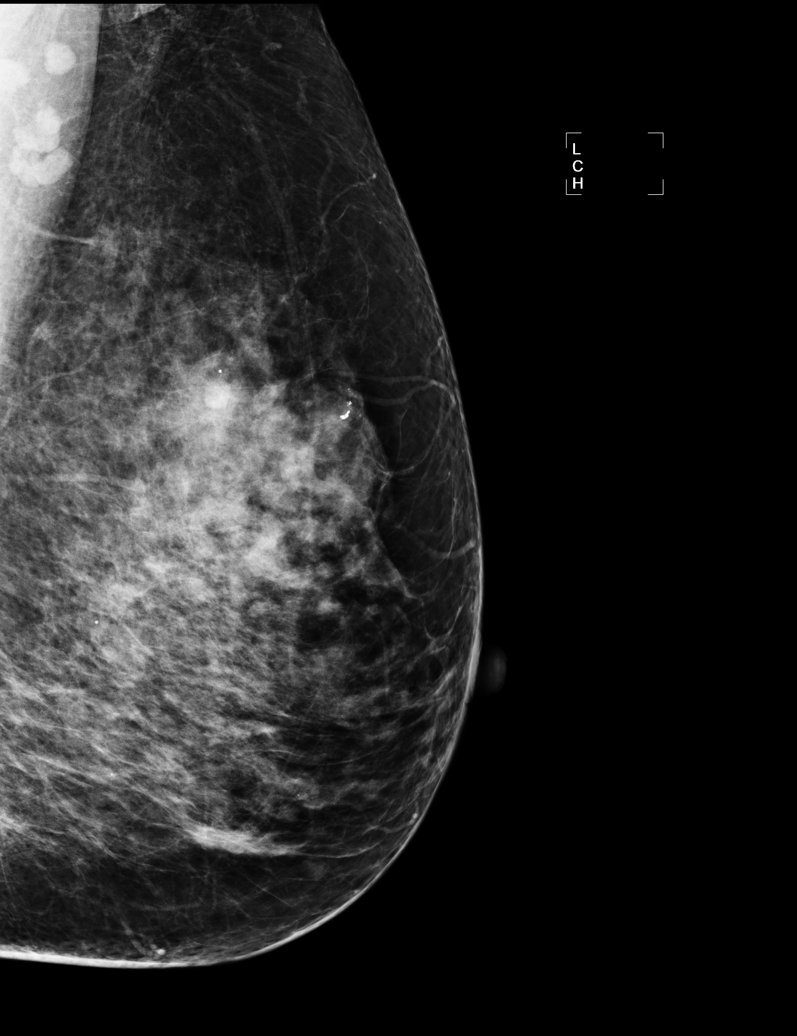

[R MLO (2 of 2)]
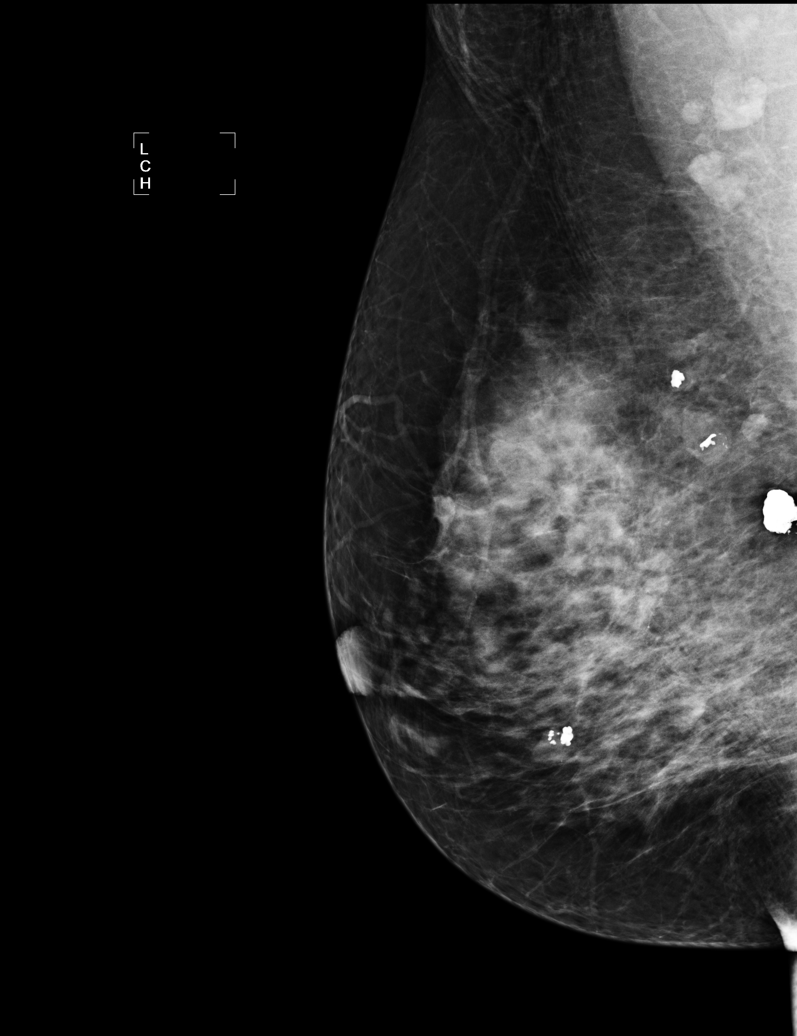

[5 of 5 positions shown; findings below may reference images not displayed]

IMPRESSION: No specific mammographic evidence of malignancy.  Next screening mammogram is recommended in one 
year.

A result letter of this screening mammogram will be mailed directly to the patient.

ASSESSMENT: Negative - BI-RADS 1

Screening mammogram in 1 year.
,

## 2012-10-30 NOTE — Addendum Note (Signed)
Encounter addended by: Saintclair Halsted, RN on: 10/30/2012  2:52 PM<BR>     Documentation filed: Visit Diagnoses, Charges VN

## 2012-11-21 NOTE — Addendum Note (Signed)
Encounter addended by: Saintclair Halsted, RN on: 11/21/2012  4:04 PM<BR>     Documentation filed: Visit Diagnoses

## 2012-11-21 NOTE — Addendum Note (Signed)
Encounter addended by: Saintclair Halsted, RN on: 11/21/2012  3:24 PM<BR>     Documentation filed: Visit Diagnoses

## 2013-05-08 ENCOUNTER — Encounter: Payer: Self-pay | Admitting: Internal Medicine

## 2013-05-08 ENCOUNTER — Ambulatory Visit (INDEPENDENT_AMBULATORY_CARE_PROVIDER_SITE_OTHER): Payer: BC Managed Care – PPO | Admitting: Internal Medicine

## 2013-05-08 ENCOUNTER — Other Ambulatory Visit (INDEPENDENT_AMBULATORY_CARE_PROVIDER_SITE_OTHER): Payer: BC Managed Care – PPO

## 2013-05-08 VITALS — BP 138/88 | HR 88 | Temp 97.7°F | Ht 61.0 in | Wt 169.0 lb

## 2013-05-08 DIAGNOSIS — Z130 Encounter for screening for diseases of the blood and blood-forming organs and certain disorders involving the immune mechanism: Secondary | ICD-10-CM

## 2013-05-08 DIAGNOSIS — Z1329 Encounter for screening for other suspected endocrine disorder: Secondary | ICD-10-CM

## 2013-05-08 DIAGNOSIS — Z0000 Encounter for general adult medical examination without abnormal findings: Secondary | ICD-10-CM

## 2013-05-08 DIAGNOSIS — E785 Hyperlipidemia, unspecified: Secondary | ICD-10-CM

## 2013-05-08 DIAGNOSIS — I10 Essential (primary) hypertension: Secondary | ICD-10-CM

## 2013-05-08 DIAGNOSIS — Z23 Encounter for immunization: Secondary | ICD-10-CM

## 2013-05-08 DIAGNOSIS — Z131 Encounter for screening for diabetes mellitus: Secondary | ICD-10-CM

## 2013-05-08 LAB — CBC
HCT: 34.6 % — ABNORMAL LOW (ref 36.0–46.0)
Hemoglobin: 11.2 g/dL — ABNORMAL LOW (ref 12.0–15.0)
MCHC: 32.4 g/dL (ref 30.0–36.0)
RDW: 13.7 % (ref 11.5–14.6)
WBC: 5.8 10*3/uL (ref 4.5–10.5)

## 2013-05-08 LAB — BASIC METABOLIC PANEL
Calcium: 9.8 mg/dL (ref 8.4–10.5)
Creatinine, Ser: 0.9 mg/dL (ref 0.4–1.2)
Sodium: 143 mEq/L (ref 135–145)

## 2013-05-08 LAB — LIPID PANEL
Cholesterol: 247 mg/dL — ABNORMAL HIGH (ref 0–200)
HDL: 65.7 mg/dL (ref >39.00)
Triglycerides: 145 mg/dL (ref 0.0–149.0)

## 2013-05-08 LAB — LDL CHOLESTEROL, DIRECT: Direct LDL: 166.2 mg/dL

## 2013-05-08 MED ORDER — LISINOPRIL-HYDROCHLOROTHIAZIDE 20-25 MG PO TABS: 1.0000 | ORAL_TABLET | Freq: Every day | ORAL | Status: DC

## 2013-05-08 NOTE — Assessment & Plan Note (Signed)
Slightly elevated Will restart lisinopril-HCT Will d/c atenolol at this time  RTC in 3 months for BP check

## 2013-05-08 NOTE — Progress Notes (Signed)
HPI  Pt presents to the clinic today to establish care. She has not had a PCP in a number of years. She does have a history of HTN. She has been out of her meds for the past 20 days. She does not monitor her blood pressure at home. She needs refills today but other than that has no concerns.   Flu: never Tetanus: more than 10 years Pap smear: 08/2012 Mammogram: 08/2012 Colonoscopy: never LMP: 04/19/2013 Eye doctor: as needed Dentist: as needed  Past Medical History  Diagnosis Date  . Hypertension   . Obesity   . Hyperlipemia   . Anemia   . History of chicken pox     Current Outpatient Prescriptions  Medication Sig Dispense Refill  . ferrous sulfate 325 (65 FE) MG tablet Take 325 mg by mouth 2 (two) times daily.      Marland Kitchen atenolol (TENORMIN) 25 MG tablet Take 25 mg by mouth daily.      Marland Kitchen lisinopril-hydrochlorothiazide (PRINZIDE,ZESTORETIC) 20-25 MG per tablet Take 1 tablet by mouth daily.       No current facility-administered medications for this visit.    No Known Allergies  Family History  Problem Relation Age of Onset  . Hypertension Mother   . Prostate cancer Maternal Grandfather   . Cancer Neg Hx   . Diabetes Neg Hx   . Early death Neg Hx   . Stroke Neg Hx     History   Social History  . Marital Status: Single    Spouse Name: N/A    Number of Children: N/A  . Years of Education: 12   Occupational History  . Not on file.   Social History Main Topics  . Smoking status: Never Smoker   . Smokeless tobacco: Never Used  . Alcohol Use: No  . Drug Use: No  . Sexually Active: Not Currently   Other Topics Concern  . Not on file   Social History Narrative   Regular exercise-yes   Caffeine Use-no    ROS:  Constitutional: Denies fever, malaise, fatigue, headache or abrupt weight changes.  HEENT: Denies eye pain, eye redness, ear pain, ringing in the ears, wax buildup, runny nose, nasal congestion, bloody nose, or sore throat. Respiratory: Denies difficulty  breathing, shortness of breath, cough or sputum production.   Cardiovascular: Denies chest pain, chest tightness, palpitations or swelling in the hands or feet.  Gastrointestinal: Denies abdominal pain, bloating, constipation, diarrhea or blood in the stool.  GU: Denies frequency, urgency, pain with urination, blood in urine, odor or discharge. Musculoskeletal: Denies decrease in range of motion, difficulty with gait, muscle pain or joint pain and swelling.  Skin: Denies redness, rashes, lesions or ulcercations.  Neurological: Denies dizziness, difficulty with memory, difficulty with speech or problems with balance and coordination.   No other specific complaints in a complete review of systems (except as listed in HPI above).  PE:  BP 138/88  Pulse 88  Temp(Src) 97.7 F (36.5 C) (Oral)  Ht 5\' 1"  (1.549 m)  Wt 169 lb (76.658 kg)  BMI 31.95 kg/m2  SpO2 96%  LMP 04/01/2013 Wt Readings from Last 3 Encounters:  05/08/13 169 lb (76.658 kg)  05/27/12 159 lb 11.2 oz (72.439 kg)  04/29/12 164 lb 14.4 oz (74.798 kg)    General: Appears her stated age, well developed, well nourished in NAD. HEENT: Head: normal shape and size; Eyes: sclera white, no icterus, conjunctiva pink, PERRLA and EOMs intact; Ears: Tm's gray and intact, normal light  reflex; Nose: mucosa pink and moist, septum midline; Throat/Mouth: Teeth present, mucosa pink and moist, no lesions or ulcerations noted.  Neck: Normal range of motion. Neck supple, trachea midline. No massses, lumps or thyromegaly present.  Cardiovascular: Normal rate and rhythm. S1,S2 noted.  Murmur noted. No rubs or gallops noted. No JVD or BLE edema. No carotid bruits noted. Pulmonary/Chest: Normal effort and positive vesicular breath sounds. No respiratory distress. No wheezes, rales or ronchi noted.  Abdomen: Soft and nontender. Normal bowel sounds, no bruits noted. No distention or masses noted. Liver, spleen and kidneys non palpable. Musculoskeletal:  Normal range of motion. No signs of joint swelling. No difficulty with gait.  Neurological: Alert and oriented. Cranial nerves II-XII intact. Coordination normal. +DTRs bilaterally. Psychiatric: Mood and affect normal. Behavior is normal. Judgment and thought content normal.      Assessment and Plan:  Preventative Health Maintenance:  Work on diet and exercise Labs obtained today Tdap given today Will set up colonoscopy

## 2013-05-08 NOTE — Addendum Note (Signed)
Addended by: Brenton Grills C on: 05/08/2013 10:16 AM   Modules accepted: Orders

## 2013-05-08 NOTE — Patient Instructions (Signed)

## 2013-05-09 ENCOUNTER — Encounter: Payer: Self-pay | Admitting: Internal Medicine

## 2013-06-24 ENCOUNTER — Ambulatory Visit (AMBULATORY_SURGERY_CENTER): Payer: BC Managed Care – PPO | Admitting: *Deleted

## 2013-06-24 VITALS — Ht 61.0 in | Wt 170.8 lb

## 2013-06-24 DIAGNOSIS — Z1211 Encounter for screening for malignant neoplasm of colon: Secondary | ICD-10-CM

## 2013-06-24 MED ORDER — NA SULFATE-K SULFATE-MG SULF 17.5-3.13-1.6 GM/177ML PO SOLN: ORAL | Status: DC

## 2013-06-24 NOTE — Progress Notes (Signed)
No allergies to eggs or soy. No problems with anesthesia.  

## 2013-06-25 ENCOUNTER — Encounter: Payer: Self-pay | Admitting: Internal Medicine

## 2013-06-26 ENCOUNTER — Telehealth: Payer: Self-pay | Admitting: Internal Medicine

## 2013-06-26 NOTE — Telephone Encounter (Signed)
Pt called to pick up sample prep on 3rd floor.

## 2013-06-26 NOTE — Telephone Encounter (Signed)
Sample of Suprep put up front for pick up.

## 2013-07-09 ENCOUNTER — Ambulatory Visit (AMBULATORY_SURGERY_CENTER): Payer: BC Managed Care – PPO | Admitting: Internal Medicine

## 2013-07-09 ENCOUNTER — Encounter: Payer: Self-pay | Admitting: Internal Medicine

## 2013-07-09 VITALS — BP 136/73 | HR 77 | Temp 98.1°F | Resp 19 | Ht 61.0 in | Wt 170.0 lb

## 2013-07-09 DIAGNOSIS — Z1211 Encounter for screening for malignant neoplasm of colon: Secondary | ICD-10-CM

## 2013-07-09 MED ORDER — SODIUM CHLORIDE 0.9 % IV SOLN: 500.0000 mL | INTRAVENOUS | Status: DC

## 2013-07-09 NOTE — Patient Instructions (Addendum)
YOU HAD AN ENDOSCOPIC PROCEDURE TODAY AT THE [facility]: Refer to the procedure report that was given to you for any specific questions about what was found during the examination.  If the procedure report does not answer your questions, please call your gastroenterologist to clarify.  If you requested that your care partner not be given the details of your procedure findings, then the procedure report has been included in a sealed envelope for you to review at your convenience later.  YOU SHOULD EXPECT: Some feelings of bloating in the abdomen. Passage of more gas than usual.  Walking can help get rid of the air that was put into your GI tract during the procedure and reduce the bloating. If you had a lower endoscopy (such as a colonoscopy or flexible sigmoidoscopy) you may notice spotting of blood in your stool or on the toilet paper. If you underwent a bowel prep for your procedure, then you may not have a normal bowel movement for a few days.  DIET: Your first meal following the procedure should be a light meal and then it is ok to progress to your normal diet.  A half-sandwich or bowl of soup is an example of a good first meal.  Heavy or fried foods are harder to digest and may make you feel nauseous or bloated.  Likewise meals heavy in dairy and vegetables can cause extra gas to form and this can also increase the bloating.  Drink plenty of fluids but you should avoid alcoholic beverages for 24 hours.  ACTIVITY: Your care partner should take you home directly after the procedure.  You should plan to take it easy, moving slowly for the rest of the day.  You can resume normal activity the day after the procedure however you should NOT DRIVE or use heavy machinery for 24 hours (because of the sedation medicines used during the test).    SYMPTOMS TO REPORT IMMEDIATELY: A gastroenterologist can be reached at any hour.  During normal business hours, 8:30 AM to 5:00 PM Monday through Friday,  call [phone].  After hours and on weekends, please call the GI answering service at [phone] who will take a message and have the physician on call contact you.   Following lower endoscopy (colonoscopy or flexible sigmoidoscopy):  Excessive amounts of blood in the stool  Significant tenderness or worsening of abdominal pains  Swelling of the abdomen that is new, acute  Fever of 100F or higher  FOLLOW UP: If any biopsies were taken you will be contacted by phone or by letter within the next 1-3 weeks.  Call your gastroenterologist if you have not heard about the biopsies in 3 weeks.  Our staff will call the home number listed on your records the next business day following your procedure to check on you and address any questions or concerns that you may have at that time regarding the information given to you following your procedure. This is a courtesy call and so if there is no answer at the home number and we have not heard from you through the emergency physician on call, we will assume that you have returned to your regular daily activities without incident.  SIGNATURES/CONFIDENTIALITY: You and/or your care partner have signed paperwork which will be entered into your electronic medical record.  These signatures attest to the fact that that the information above on your After Visit Summary has been reviewed and is understood.  Full responsibility of the confidentiality of this   discharge information lies with you and/or your care-partner.  Resume medications. 

## 2013-07-09 NOTE — Progress Notes (Signed)
Patient did not experience any of the following events: a burn prior to discharge; a fall within the facility; wrong site/side/patient/procedure/implant event; or a hospital transfer or hospital admission upon discharge from the facility. (G8907) Patient did not have preoperative order for IV antibiotic SSI prophylaxis. (G8918)  

## 2013-07-09 NOTE — Progress Notes (Signed)
Procedure ends, to recovery, report given and VSS. 

## 2013-07-09 NOTE — Op Note (Signed)
[facility] [address] [place], [address]   COLONOSCOPY PROCEDURE REPORT  PATIENT: Anita Ramirez, Anita Ramirez  MR#: [id number] BIRTHDATE: [birth year] , 51  yrs. old GENDER: Female ENDOSCOPIST: Iva Boop, MD, [facility] REFERRED BY:   Nicki Reaper, NP PROCEDURE DATE:  07/09/2013 PROCEDURE:   Colonoscopy, screening First Screening Colonoscopy - Avg.  risk and is 50 yrs.  old or older Yes.  Prior Negative Screening - Now for repeat screening. N/A  History of Adenoma - Now for follow-up colonoscopy & has been > or = to 3 yrs.  N/A  Polyps Removed Today? No.  Recommend repeat exam, <10 yrs? No. ASA CLASS:   Class II INDICATIONS:average risk screening and first colonoscopy. MEDICATIONS: propofol (Diprivan) 200mg  IV, MAC sedation, administered by CRNA, and These medications were titrated to patient response per physician's verbal order  DESCRIPTION OF PROCEDURE:   After the risks benefits and alternatives of the procedure were thoroughly explained, informed consent was obtained.  A digital rectal exam revealed no rectal mass and A digital rectal exam revealed several skin tags.   The LB [id number]  endoscope was introduced through the anus and advanced to the cecum, which was identified by both the appendix and ileocecal valve. No adverse events experienced.   The quality of the prep was excellent using Suprep  The instrument was then slowly withdrawn as the colon was fully examined.      COLON FINDINGS: A normal appearing cecum, ileocecal valve, and appendiceal orifice were identified.  The ascending, hepatic flexure, transverse, splenic flexure, descending, sigmoid colon and rectum appeared unremarkable.  No polyps or cancers were seen.   A right colon retroflexion was performed.  Retroflexed views revealed no abnormalities. The time to cecum=3 minutes 33 seconds. Withdrawal time=8 minutes 54 seconds.  The scope was withdrawn and the procedure  completed. COMPLICATIONS: There were no complications.  ENDOSCOPIC IMPRESSION: Normal colonoscopy excellent prep (first colonoscopy)  RECOMMENDATIONS: Repeat Colonscopy in 10 years.   eSigned:  Iva Boop, MD, [facility] 07/09/2013 11:31 AM   cc: The Patient and Nicki Reaper, NP

## 2013-07-10 ENCOUNTER — Telehealth: Payer: Self-pay | Admitting: *Deleted

## 2013-07-10 NOTE — Telephone Encounter (Signed)
  Follow up Call-unable to reach pt. On any numbers given.

## 2013-08-11 ENCOUNTER — Other Ambulatory Visit: Payer: Self-pay | Admitting: Internal Medicine

## 2013-08-11 DIAGNOSIS — Z1231 Encounter for screening mammogram for malignant neoplasm of breast: Secondary | ICD-10-CM

## 2013-08-29 ENCOUNTER — Ambulatory Visit (HOSPITAL_COMMUNITY): Payer: BC Managed Care – PPO

## 2013-09-01 ENCOUNTER — Ambulatory Visit (HOSPITAL_COMMUNITY)
Admission: RE | Admit: 2013-09-01 | Discharge: 2013-09-01 | Disposition: A | Discharge status: Home / Self Care | Payer: BC Managed Care – PPO | Source: Ambulatory Visit | Attending: Internal Medicine | Admitting: Internal Medicine

## 2013-09-01 DIAGNOSIS — Z1231 Encounter for screening mammogram for malignant neoplasm of breast: Secondary | ICD-10-CM | POA: Insufficient documentation

## 2013-09-15 ENCOUNTER — Other Ambulatory Visit: Payer: Self-pay

## 2013-09-15 DIAGNOSIS — I10 Essential (primary) hypertension: Secondary | ICD-10-CM

## 2013-09-15 MED ORDER — LISINOPRIL-HYDROCHLOROTHIAZIDE 20-25 MG PO TABS: 1.0000 | ORAL_TABLET | Freq: Every day | ORAL | Status: DC

## 2013-11-06 ENCOUNTER — Ambulatory Visit (INDEPENDENT_AMBULATORY_CARE_PROVIDER_SITE_OTHER): Payer: BC Managed Care – PPO | Admitting: Internal Medicine

## 2013-11-06 ENCOUNTER — Encounter: Payer: Self-pay | Admitting: Internal Medicine

## 2013-11-06 VITALS — BP 126/68 | HR 80 | Temp 98.0°F | Ht 60.0 in | Wt 169.0 lb

## 2013-11-06 DIAGNOSIS — Z23 Encounter for immunization: Secondary | ICD-10-CM

## 2013-11-06 DIAGNOSIS — E785 Hyperlipidemia, unspecified: Secondary | ICD-10-CM

## 2013-11-06 LAB — LIPID PANEL
Cholesterol: 240 mg/dL — ABNORMAL HIGH (ref 0–200)
HDL: 47.3 mg/dL (ref >39.00)
VLDL: 69 mg/dL — ABNORMAL HIGH (ref 0.0–40.0)

## 2013-11-06 NOTE — Progress Notes (Signed)
Pre-visit discussion using our clinic review tool. No additional management support is needed unless otherwise documented below in the visit note.  

## 2013-11-06 NOTE — Progress Notes (Signed)
Subjective:    Patient ID: Anita Ramirez, female    DOB: 20-Jan-1961, 52 y.o.   MRN: [id number]  HPI  Pt presents to the clinic today to f/u lipids. 6 months ago, her lipids were elevated. She was advised to work on diet and exercise. She has not really been working on either one.   Review of Systems      Past Medical History  Diagnosis Date  . Hypertension   . Obesity   . Hyperlipemia   . Anemia   . History of chicken pox     Current Outpatient Prescriptions  Medication Sig Dispense Refill  . lisinopril-hydrochlorothiazide (PRINZIDE,ZESTORETIC) 20-25 MG per tablet Take 1 tablet by mouth daily.  30 tablet  2   No current facility-administered medications for this visit.    No Known Allergies  Family History  Problem Relation Age of Onset  . Hypertension Mother   . Prostate cancer Maternal Grandfather   . Cancer Neg Hx   . Diabetes Neg Hx   . Early death Neg Hx   . Stroke Neg Hx   . Colon cancer Neg Hx     History   Social History  . Marital Status: Single    Spouse Name: N/A    Number of Children: N/A  . Years of Education: 12   Occupational History  . Not on file.   Social History Main Topics  . Smoking status: Never Smoker   . Smokeless tobacco: Never Used  . Alcohol Use: No  . Drug Use: No  . Sexual Activity: Not Currently   Other Topics Concern  . Not on file   Social History Narrative   Regular exercise-yes   Caffeine Use-no     Constitutional: Denies fever, malaise, fatigue, headache or abrupt weight changes.   Cardiovascular: Denies chest pain, chest tightness, palpitations or swelling in the hands or feet.   Neurological: Denies dizziness, difficulty with memory, difficulty with speech or problems with balance and coordination.   No other specific complaints in a complete review of systems (except as listed in HPI above).  Objective:   Physical Exam  BP 126/68  Pulse 80  Temp(Src) 98 F (36.7 C) (Oral)  Ht 5' (1.524 m)   Wt 169 lb (76.658 kg)  BMI 33.01 kg/m2  SpO2 97%  LMP 08/19/2013 Wt Readings from Last 3 Encounters:  11/06/13 169 lb (76.658 kg)  07/09/13 170 lb (77.111 kg)  06/24/13 170 lb 12.8 oz (77.474 kg)    General: Appears her stated age, well developed, well nourished in NAD. Cardiovascular: Normal rate and rhythm. S1,S2 noted.  No murmur, rubs or gallops noted. No JVD or BLE edema. No carotid bruits noted. Pulmonary/Chest: Normal effort and positive vesicular breath sounds. No respiratory distress. No wheezes, rales or ronchi noted.  Neurological: Alert and oriented. Cranial nerves II-XII intact. Coordination normal. +DTRs bilaterally.  BMET    Component Value Date/Time   NA 143 05/08/2013 1004   K 3.5 05/08/2013 1004   CL 109 05/08/2013 1004   CO2 26 05/08/2013 1004   GLUCOSE 104* 05/08/2013 1004   BUN 15 05/08/2013 1004   CREATININE 0.9 05/08/2013 1004   CALCIUM 9.8 05/08/2013 1004    Lipid Panel     Component Value Date/Time   CHOL 247* 05/08/2013 1004   TRIG 145.0 05/08/2013 1004   HDL 65.70 05/08/2013 1004   CHOLHDL 4 05/08/2013 1004   VLDL 29.0 05/08/2013 1004   LDLCALC 72 04/07/2010 2027  CBC    Component Value Date/Time   WBC 5.8 05/08/2013 1004   WBC 6.5 05/27/2012 1113   RBC 3.94 05/08/2013 1004   RBC 3.75 05/27/2012 1113   HGB 11.2* 05/08/2013 1004   HGB 11.0* 05/27/2012 1113   HCT 34.6* 05/08/2013 1004   HCT 33.7* 05/27/2012 1113   PLT 231.0 05/08/2013 1004   PLT 205 05/27/2012 1113   MCV 87.9 05/08/2013 1004   MCV 90.0 05/27/2012 1113   MCH 29.5 05/27/2012 1113   MCHC 32.4 05/08/2013 1004   MCHC 32.7 05/27/2012 1113   RDW 13.7 05/08/2013 1004   RDW 12.2 05/27/2012 1113   LYMPHSABS 3.0 05/27/2012 1113   LYMPHSABS 3.0 06/14/2010 1949   MONOABS 0.5 05/27/2012 1113   MONOABS 0.4 06/14/2010 1949   EOSABS 0.0 05/27/2012 1113   EOSABS 0.1 06/14/2010 1949   BASOSABS 0.0 05/27/2012 1113   BASOSABS 0.0 06/14/2010 1949    Hgb A1C Lab Results  Component Value Date   HGBA1C 4.7 05/08/2013          Assessment & Plan:   Flu shot today!

## 2013-11-06 NOTE — Assessment & Plan Note (Signed)
Will recheck lipid profile today If remains elevated, will start zocor 10 mg daily  RTC in 6 months

## 2013-11-06 NOTE — Patient Instructions (Signed)
Fat and Cholesterol Control Diet  Fat and cholesterol levels in your blood and organs are influenced by your diet. High levels of fat and cholesterol may lead to diseases of the heart, small and large blood vessels, gallbladder, liver, and pancreas.  CONTROLLING FAT AND CHOLESTEROL WITH DIET  Although exercise and lifestyle factors are important, your diet is key. That is because certain foods are known to raise cholesterol and others to lower it. The goal is to balance foods for their effect on cholesterol and more importantly, to replace saturated and trans fat with other types of fat, such as monounsaturated fat, polyunsaturated fat, and omega-3 fatty acids.  On average, a person should consume no more than 15 to 17 g of saturated fat daily. Saturated and trans fats are considered "bad" fats, and they will raise LDL cholesterol. Saturated fats are primarily found in animal products such as meats, butter, and cream. However, that does not mean you need to give up all your favorite foods. Today, there are good tasting, low-fat, low-cholesterol substitutes for most of the things you like to eat. Choose low-fat or nonfat alternatives. Choose round or loin cuts of red meat. These types of cuts are lowest in fat and cholesterol. Chicken (without the skin), fish, veal, and ground turkey breast are great choices. Eliminate fatty meats, such as hot dogs and salami. Even shellfish have little or no saturated fat. Have a 3 oz (85 g) portion when you eat lean meat, poultry, or fish.  Trans fats are also called "partially hydrogenated oils." They are oils that have been scientifically manipulated so that they are solid at room temperature resulting in a longer shelf life and improved taste and texture of foods in which they are added. Trans fats are found in stick margarine, some tub margarines, cookies, crackers, and baked goods.   When baking and cooking, oils are a great substitute for butter. The monounsaturated oils are  especially beneficial since it is believed they lower LDL and raise HDL. The oils you should avoid entirely are saturated tropical oils, such as coconut and palm.   Remember to eat a lot from food groups that are naturally free of saturated and trans fat, including fish, fruit, vegetables, beans, grains (barley, rice, couscous, bulgur wheat), and pasta (without cream sauces).   IDENTIFYING FOODS THAT LOWER FAT AND CHOLESTEROL   Soluble fiber may lower your cholesterol. This type of fiber is found in fruits such as apples, vegetables such as broccoli, potatoes, and carrots, legumes such as beans, peas, and lentils, and grains such as barley. Foods fortified with plant sterols (phytosterol) may also lower cholesterol. You should eat at least 2 g per day of these foods for a cholesterol lowering effect.   Read package labels to identify low-saturated fats, trans fat free, and low-fat foods at the supermarket. Select cheeses that have only 2 to 3 g saturated fat per ounce. Use a heart-healthy tub margarine that is free of trans fats or partially hydrogenated oil. When buying baked goods (cookies, crackers), avoid partially hydrogenated oils. Breads and muffins should be made from whole grains (whole-wheat or whole oat flour, instead of "flour" or "enriched flour"). Buy non-creamy canned soups with reduced salt and no added fats.   FOOD PREPARATION TECHNIQUES   Never deep-fry. If you must fry, either stir-fry, which uses very little fat, or use non-stick cooking sprays. When possible, broil, bake, or roast meats, and steam vegetables. Instead of putting butter or margarine on vegetables, use lemon   and herbs, applesauce, and cinnamon (for squash and sweet potatoes). Use nonfat yogurt, salsa, and low-fat dressings for salads.   LOW-SATURATED FAT / LOW-FAT FOOD SUBSTITUTES  Meats / Saturated Fat (g)  · Avoid: Steak, marbled (3 oz/85 g) / 11 g  · Choose: Steak, lean (3 oz/85 g) / 4 g  · Avoid: Hamburger (3 oz/85 g) / 7  g  · Choose: Hamburger, lean (3 oz/85 g) / 5 g  · Avoid: Ham (3 oz/85 g) / 6 g  · Choose: Ham, lean cut (3 oz/85 g) / 2.4 g  · Avoid: Chicken, with skin, dark meat (3 oz/85 g) / 4 g  · Choose: Chicken, skin removed, dark meat (3 oz/85 g) / 2 g  · Avoid: Chicken, with skin, light meat (3 oz/85 g) / 2.5 g  · Choose: Chicken, skin removed, light meat (3 oz/85 g) / 1 g  Dairy / Saturated Fat (g)  · Avoid: Whole milk (1 cup) / 5 g  · Choose: Low-fat milk, 2% (1 cup) / 3 g  · Choose: Low-fat milk, 1% (1 cup) / 1.5 g  · Choose: Skim milk (1 cup) / 0.3 g  · Avoid: Hard cheese (1 oz/28 g) / 6 g  · Choose: Skim milk cheese (1 oz/28 g) / 2 to 3 g  · Avoid: Cottage cheese, 4% fat (1 cup) / 6.5 g  · Choose: Low-fat cottage cheese, 1% fat (1 cup) / 1.5 g  · Avoid: Ice cream (1 cup) / 9 g  · Choose: Sherbet (1 cup) / 2.5 g  · Choose: Nonfat frozen yogurt (1 cup) / 0.3 g  · Choose: Frozen fruit bar / trace  · Avoid: Whipped cream (1 tbs) / 3.5 g  · Choose: Nondairy whipped topping (1 tbs) / 1 g  Condiments / Saturated Fat (g)  · Avoid: Mayonnaise (1 tbs) / 2 g  · Choose: Low-fat mayonnaise (1 tbs) / 1 g  · Avoid: Butter (1 tbs) / 7 g  · Choose: Extra light margarine (1 tbs) / 1 g  · Avoid: Coconut oil (1 tbs) / 11.8 g  · Choose: Olive oil (1 tbs) / 1.8 g  · Choose: Corn oil (1 tbs) / 1.7 g  · Choose: Safflower oil (1 tbs) / 1.2 g  · Choose: Sunflower oil (1 tbs) / 1.4 g  · Choose: Soybean oil (1 tbs) / 2.4 g  · Choose: Canola oil (1 tbs) / 1 g  Document Released: 11/06/2005 Document Revised: 03/03/2013 Document Reviewed: 04/27/2011  [employer]® Patient Information ©2014 [employer].

## 2013-11-06 NOTE — Addendum Note (Signed)
Addended by: Desmond Dike on: 11/06/2013 09:40 AM   Modules accepted: Orders

## 2013-11-07 ENCOUNTER — Ambulatory Visit: Payer: BC Managed Care – PPO | Admitting: Internal Medicine

## 2013-11-07 ENCOUNTER — Other Ambulatory Visit: Payer: Self-pay

## 2013-11-07 MED ORDER — SIMVASTATIN 10 MG PO TABS: 10.0000 mg | ORAL_TABLET | Freq: Every day | ORAL | Status: DC

## 2014-01-01 ENCOUNTER — Other Ambulatory Visit: Payer: Self-pay

## 2014-01-01 DIAGNOSIS — I10 Essential (primary) hypertension: Secondary | ICD-10-CM

## 2014-01-01 MED ORDER — LISINOPRIL-HYDROCHLOROTHIAZIDE 20-25 MG PO TABS: 1.0000 | ORAL_TABLET | Freq: Every day | ORAL | Status: DC

## 2014-02-05 ENCOUNTER — Ambulatory Visit: Payer: BC Managed Care – PPO | Admitting: Internal Medicine

## 2014-02-05 ENCOUNTER — Ambulatory Visit (INDEPENDENT_AMBULATORY_CARE_PROVIDER_SITE_OTHER): Payer: BC Managed Care – PPO | Admitting: Internal Medicine

## 2014-02-05 ENCOUNTER — Encounter: Payer: Self-pay | Admitting: Internal Medicine

## 2014-02-05 VITALS — BP 122/76 | HR 78 | Temp 97.7°F | Wt 162.5 lb

## 2014-02-05 DIAGNOSIS — E785 Hyperlipidemia, unspecified: Secondary | ICD-10-CM

## 2014-02-05 NOTE — Progress Notes (Signed)
Subjective:    Patient ID: Anita Ramirez, female    DOB: 09/06/1961, 53 y.o.   MRN: [id number]  HPI  Pt presents to the clinic today for 3 month follow up of high cholesterol. She was started on Zocor 10/2013 for elevated cholesterol and triglycerides. She has been tolerating the medication well without side effects. She denies myalgias or joint pain. She has been trying to work on diet. She does not exercise.   Review of Systems      Past Medical History  Diagnosis Date  . Hypertension   . Obesity   . Hyperlipemia   . Anemia   . History of chicken pox     Current Outpatient Prescriptions  Medication Sig Dispense Refill  . lisinopril-hydrochlorothiazide (PRINZIDE,ZESTORETIC) 20-25 MG per tablet Take 1 tablet by mouth daily.  30 tablet  1  . simvastatin (ZOCOR) 10 MG tablet Take 1 tablet (10 mg total) by mouth at bedtime.  30 tablet  2   No current facility-administered medications for this visit.    No Known Allergies  Family History  Problem Relation Age of Onset  . Hypertension Mother   . Prostate cancer Maternal Grandfather   . Cancer Neg Hx   . Diabetes Neg Hx   . Early death Neg Hx   . Stroke Neg Hx   . Colon cancer Neg Hx     History   Social History  . Marital Status: Single    Spouse Name: N/A    Number of Children: N/A  . Years of Education: 12   Occupational History  . Not on file.   Social History Main Topics  . Smoking status: Never Smoker   . Smokeless tobacco: Never Used  . Alcohol Use: No  . Drug Use: No  . Sexual Activity: Not Currently   Other Topics Concern  . Not on file   Social History Narrative   Regular exercise-yes   Caffeine Use-no     Constitutional: Denies fever, malaise, fatigue, headache or abrupt weight changes.  Respiratory: Denies difficulty breathing, shortness of breath, cough or sputum production.   Cardiovascular: Denies chest pain, chest tightness, palpitations or swelling in the hands or feet.    No  other specific complaints in a complete review of systems (except as listed in HPI above).  Objective:   Physical Exam   BP 122/76  Pulse 78  Temp(Src) 97.7 F (36.5 C) (Oral)  Wt 162 lb 8 oz (73.71 kg)  SpO2 97% Wt Readings from Last 3 Encounters:  02/05/14 162 lb 8 oz (73.71 kg)  11/06/13 169 lb (76.658 kg)  07/09/13 170 lb (77.111 kg)    General: Appears her stated age, well developed, well nourished in NAD. Cardiovascular: Normal rate and rhythm. S1,S2 noted.  No murmur, rubs or gallops noted. No JVD or BLE edema. No carotid bruits noted. Pulmonary/Chest: Normal effort and positive vesicular breath sounds. No respiratory distress. No wheezes, rales or ronchi noted.   BMET    Component Value Date/Time   NA 143 05/08/2013 1004   K 3.5 05/08/2013 1004   CL 109 05/08/2013 1004   CO2 26 05/08/2013 1004   GLUCOSE 104* 05/08/2013 1004   BUN 15 05/08/2013 1004   CREATININE 0.9 05/08/2013 1004   CALCIUM 9.8 05/08/2013 1004    Lipid Panel     Component Value Date/Time   CHOL 240* 11/06/2013 0938   TRIG 345.0* 11/06/2013 0938   HDL 47.30 11/06/2013 0938   CHOLHDL 5  11/06/2013 0938   VLDL 69.0* 11/06/2013 0938   LDLCALC 72 04/07/2010 2027    CBC    Component Value Date/Time   WBC 5.8 05/08/2013 1004   WBC 6.5 05/27/2012 1113   RBC 3.94 05/08/2013 1004   RBC 3.75 05/27/2012 1113   HGB 11.2* 05/08/2013 1004   HGB 11.0* 05/27/2012 1113   HCT 34.6* 05/08/2013 1004   HCT 33.7* 05/27/2012 1113   PLT 231.0 05/08/2013 1004   PLT 205 05/27/2012 1113   MCV 87.9 05/08/2013 1004   MCV 90.0 05/27/2012 1113   MCH 29.5 05/27/2012 1113   MCHC 32.4 05/08/2013 1004   MCHC 32.7 05/27/2012 1113   RDW 13.7 05/08/2013 1004   RDW 12.2 05/27/2012 1113   LYMPHSABS 3.0 05/27/2012 1113   LYMPHSABS 3.0 06/14/2010 1949   MONOABS 0.5 05/27/2012 1113   MONOABS 0.4 06/14/2010 1949   EOSABS 0.0 05/27/2012 1113   EOSABS 0.1 06/14/2010 1949   BASOSABS 0.0 05/27/2012 1113   BASOSABS 0.0 06/14/2010 1949    Hgb A1C Lab Results    Component Value Date   HGBA1C 4.7 05/08/2013        Assessment & Plan:

## 2014-02-05 NOTE — Patient Instructions (Addendum)
Fat and Cholesterol Control Diet  Fat and cholesterol levels in your blood and organs are influenced by your diet. High levels of fat and cholesterol may lead to diseases of the heart, small and large blood vessels, gallbladder, liver, and pancreas.  CONTROLLING FAT AND CHOLESTEROL WITH DIET  Although exercise and lifestyle factors are important, your diet is key. That is because certain foods are known to raise cholesterol and others to lower it. The goal is to balance foods for their effect on cholesterol and more importantly, to replace saturated and trans fat with other types of fat, such as monounsaturated fat, polyunsaturated fat, and omega-3 fatty acids.  On average, a person should consume no more than 15 to 17 g of saturated fat daily. Saturated and trans fats are considered "bad" fats, and they will raise LDL cholesterol. Saturated fats are primarily found in animal products such as meats, butter, and cream. However, that does not mean you need to give up all your favorite foods. Today, there are good tasting, low-fat, low-cholesterol substitutes for most of the things you like to eat. Choose low-fat or nonfat alternatives. Choose round or loin cuts of red meat. These types of cuts are lowest in fat and cholesterol. Chicken (without the skin), fish, veal, and ground turkey breast are great choices. Eliminate fatty meats, such as hot dogs and salami. Even shellfish have little or no saturated fat. Have a 3 oz (85 g) portion when you eat lean meat, poultry, or fish.  Trans fats are also called "partially hydrogenated oils." They are oils that have been scientifically manipulated so that they are solid at room temperature resulting in a longer shelf life and improved taste and texture of foods in which they are added. Trans fats are found in stick margarine, some tub margarines, cookies, crackers, and baked goods.   When baking and cooking, oils are a great substitute for butter. The monounsaturated oils are  especially beneficial since it is believed they lower LDL and raise HDL. The oils you should avoid entirely are saturated tropical oils, such as coconut and palm.   Remember to eat a lot from food groups that are naturally free of saturated and trans fat, including fish, fruit, vegetables, beans, grains (barley, rice, couscous, bulgur wheat), and pasta (without cream sauces).   IDENTIFYING FOODS THAT LOWER FAT AND CHOLESTEROL   Soluble fiber may lower your cholesterol. This type of fiber is found in fruits such as apples, vegetables such as broccoli, potatoes, and carrots, legumes such as beans, peas, and lentils, and grains such as barley. Foods fortified with plant sterols (phytosterol) may also lower cholesterol. You should eat at least 2 g per day of these foods for a cholesterol lowering effect.   Read package labels to identify low-saturated fats, trans fat free, and low-fat foods at the supermarket. Select cheeses that have only 2 to 3 g saturated fat per ounce. Use a heart-healthy tub margarine that is free of trans fats or partially hydrogenated oil. When buying baked goods (cookies, crackers), avoid partially hydrogenated oils. Breads and muffins should be made from whole grains (whole-wheat or whole oat flour, instead of "flour" or "enriched flour"). Buy non-creamy canned soups with reduced salt and no added fats.   FOOD PREPARATION TECHNIQUES   Never deep-fry. If you must fry, either stir-fry, which uses very little fat, or use non-stick cooking sprays. When possible, broil, bake, or roast meats, and steam vegetables. Instead of putting butter or margarine on vegetables, use lemon   and herbs, applesauce, and cinnamon (for squash and sweet potatoes). Use nonfat yogurt, salsa, and low-fat dressings for salads.   LOW-SATURATED FAT / LOW-FAT FOOD SUBSTITUTES  Meats / Saturated Fat (g)  · Avoid: Steak, marbled (3 oz/85 g) / 11 g  · Choose: Steak, lean (3 oz/85 g) / 4 g  · Avoid: Hamburger (3 oz/85 g) / 7  g  · Choose: Hamburger, lean (3 oz/85 g) / 5 g  · Avoid: Ham (3 oz/85 g) / 6 g  · Choose: Ham, lean cut (3 oz/85 g) / 2.4 g  · Avoid: Chicken, with skin, dark meat (3 oz/85 g) / 4 g  · Choose: Chicken, skin removed, dark meat (3 oz/85 g) / 2 g  · Avoid: Chicken, with skin, light meat (3 oz/85 g) / 2.5 g  · Choose: Chicken, skin removed, light meat (3 oz/85 g) / 1 g  Dairy / Saturated Fat (g)  · Avoid: Whole milk (1 cup) / 5 g  · Choose: Low-fat milk, 2% (1 cup) / 3 g  · Choose: Low-fat milk, 1% (1 cup) / 1.5 g  · Choose: Skim milk (1 cup) / 0.3 g  · Avoid: Hard cheese (1 oz/28 g) / 6 g  · Choose: Skim milk cheese (1 oz/28 g) / 2 to 3 g  · Avoid: Cottage cheese, 4% fat (1 cup) / 6.5 g  · Choose: Low-fat cottage cheese, 1% fat (1 cup) / 1.5 g  · Avoid: Ice cream (1 cup) / 9 g  · Choose: Sherbet (1 cup) / 2.5 g  · Choose: Nonfat frozen yogurt (1 cup) / 0.3 g  · Choose: Frozen fruit bar / trace  · Avoid: Whipped cream (1 tbs) / 3.5 g  · Choose: Nondairy whipped topping (1 tbs) / 1 g  Condiments / Saturated Fat (g)  · Avoid: Mayonnaise (1 tbs) / 2 g  · Choose: Low-fat mayonnaise (1 tbs) / 1 g  · Avoid: Butter (1 tbs) / 7 g  · Choose: Extra light margarine (1 tbs) / 1 g  · Avoid: Coconut oil (1 tbs) / 11.8 g  · Choose: Olive oil (1 tbs) / 1.8 g  · Choose: Corn oil (1 tbs) / 1.7 g  · Choose: Safflower oil (1 tbs) / 1.2 g  · Choose: Sunflower oil (1 tbs) / 1.4 g  · Choose: Soybean oil (1 tbs) / 2.4 g  · Choose: Canola oil (1 tbs) / 1 g  Document Released: 11/06/2005 Document Revised: 03/03/2013 Document Reviewed: 04/27/2011  Exit[employer]® Patient Information ©2014 [employer].

## 2014-02-05 NOTE — Assessment & Plan Note (Signed)
Will recheck lipid profile today Will adjust zocor if needed

## 2014-02-05 NOTE — Progress Notes (Signed)
Pre visit review using our clinic review tool, if applicable. No additional management support is needed unless otherwise documented below in the visit note. 

## 2014-02-06 LAB — LIPID PANEL
Cholesterol: 160 mg/dL (ref 0–200)
HDL: 45.7 mg/dL (ref >39.00)
LDL Cholesterol: 84 mg/dL (ref 0–99)
Total CHOL/HDL Ratio: 4
Triglycerides: 150 mg/dL — ABNORMAL HIGH (ref 0.0–149.0)
VLDL: 30 mg/dL (ref 0.0–40.0)

## 2014-02-20 ENCOUNTER — Encounter (HOSPITAL_COMMUNITY): Payer: Self-pay | Admitting: Emergency Medicine

## 2014-02-20 ENCOUNTER — Emergency Department (HOSPITAL_COMMUNITY)
Admission: EM | Admit: 2014-02-20 | Discharge: 2014-02-20 | Disposition: A | Discharge status: Home / Self Care | Payer: BC Managed Care – PPO | Attending: Emergency Medicine | Admitting: Emergency Medicine

## 2014-02-20 DIAGNOSIS — L0291 Cutaneous abscess, unspecified: Secondary | ICD-10-CM

## 2014-02-20 DIAGNOSIS — E669 Obesity, unspecified: Secondary | ICD-10-CM | POA: Insufficient documentation

## 2014-02-20 DIAGNOSIS — I10 Essential (primary) hypertension: Secondary | ICD-10-CM | POA: Insufficient documentation

## 2014-02-20 DIAGNOSIS — Z8619 Personal history of other infectious and parasitic diseases: Secondary | ICD-10-CM | POA: Insufficient documentation

## 2014-02-20 DIAGNOSIS — E785 Hyperlipidemia, unspecified: Secondary | ICD-10-CM | POA: Insufficient documentation

## 2014-02-20 DIAGNOSIS — Z79899 Other long term (current) drug therapy: Secondary | ICD-10-CM | POA: Insufficient documentation

## 2014-02-20 DIAGNOSIS — L03319 Cellulitis of trunk, unspecified: Principal | ICD-10-CM

## 2014-02-20 DIAGNOSIS — Z862 Personal history of diseases of the blood and blood-forming organs and certain disorders involving the immune mechanism: Secondary | ICD-10-CM | POA: Insufficient documentation

## 2014-02-20 DIAGNOSIS — L02219 Cutaneous abscess of trunk, unspecified: Secondary | ICD-10-CM | POA: Insufficient documentation

## 2014-02-20 MED ORDER — HYDROCODONE-ACETAMINOPHEN 5-325 MG PO TABS: 1.0000 | ORAL_TABLET | ORAL | Status: DC | PRN

## 2014-02-20 NOTE — ED Notes (Addendum)
Pt c/o ruptured abscess on L upper chest x 1 week.  Denies pain.  Pt reports that she has had the abscess for many years and it kept getting bigger, until last week.

## 2014-02-20 NOTE — ED Provider Notes (Signed)
CSN: [id number]     Arrival date & time 02/20/14  1631 History  This chart was scribed for non-physician practitioner, Elpidio AnisShari Cree Kunert, PA-C,working with Shanna CiscoMegan E Docherty, MD, by Karle PlumberJennifer Tensley, ED Scribe.  This patient was seen in room WTR5/WTR5 and the patient's care was started at 4:59 PM.  Chief Complaint  Patient presents with  . Abscess   The history is provided by the patient. No language interpreter was used.   HPI Comments:  Anita AnisCynthia A Clagett is a 53 y.o. female with h/o HTN, who presents to the Emergency Department complaining of an abscess to the left upper chest that she has had since childhood. Pt reports the area has become painful and started draining on its own. Pt denies fever, chills, or streaking.   Past Medical History  Diagnosis Date  . Hypertension   . Obesity   . Hyperlipemia   . Anemia   . History of chicken pox    Past Surgical History  Procedure Laterality Date  . No prior surgery     Family History  Problem Relation Age of Onset  . Hypertension Mother   . Prostate cancer Maternal Grandfather   . Cancer Neg Hx   . Diabetes Neg Hx   . Early death Neg Hx   . Stroke Neg Hx   . Colon cancer Neg Hx    History  Substance Use Topics  . Smoking status: Never Smoker   . Smokeless tobacco: Never Used  . Alcohol Use: No   OB History   Grav Para Term Preterm Abortions TAB SAB Ect Mult Living                 Review of Systems  Constitutional: Negative for fever and chills.  Skin:       Abscess to left upper chest  All other systems reviewed and are negative.   Allergies  Review of patient's allergies indicates no known allergies.  Home Medications   Current Outpatient Rx  Name  Route  Sig  Dispense  Refill  . lisinopril-hydrochlorothiazide (PRINZIDE,ZESTORETIC) 20-25 MG per tablet   Oral   Take 1 tablet by mouth daily.   30 tablet   1     Patient needs appointment for anymore refills   . simvastatin (ZOCOR) 10 MG tablet   Oral   Take 1  tablet (10 mg total) by mouth at bedtime.   30 tablet   2    Triage Vitals: BP 122/73  Pulse 98  Temp(Src) 98.8 F (37.1 C) (Oral)  Resp 16  SpO2 100% Physical Exam  Nursing note and vitals reviewed. Constitutional: She is oriented to person, place, and time. She appears well-developed and well-nourished.  HENT:  Head: Normocephalic and atraumatic.  Eyes: EOM are normal.  Neck: Normal range of motion.  Cardiovascular: Normal rate.   Pulmonary/Chest: Effort normal.  Musculoskeletal: Normal range of motion.  Neurological: She is alert and oriented to person, place, and time.  Skin: Skin is warm and dry.  Tender, swollen well demarcated lesion to left upper chest wall consistent with abscess.   Psychiatric: She has a normal mood and affect. Her behavior is normal.    ED Course  Procedures (including critical care time) DIAGNOSTIC STUDIES: Oxygen Saturation is 100% on RA, normal by my interpretation.   COORDINATION OF CARE: 5:01 PM- Will drain abscess. Offered pt pain medication but she declined. Pt verbalizes understanding and agrees to plan.  INCISION AND DRAINAGE PROCEDURE NOTE: Patient identification was confirmed  and verbal consent was obtained. This procedure was performed by Elpidio Anis, PA-C at 5:34 PM. Site: left upper chest wall Sterile procedures observed Needle size: 25G Anesthetic used (type and amt): Lidocaine 2% with Epinephrine (1 mL) Blade size: #11 Drainage: small Complexity: Complex Packing used: 1/4 inch iodoform Site anesthetized, incision made over site, wound drained and explored loculations, rinsed with copious amounts of normal saline, wound packed with sterile gauze, covered with dry, sterile dressing.  Pt tolerated procedure well without complications.  Instructions for care discussed verbally and pt provided with additional written instructions for homecare and f/u.   Medications - No data to display  Labs Review Labs Reviewed - No  data to display Imaging Review No results found.   EKG Interpretation None      MDM   Final diagnoses:  None    1. Abscess, chest wall  Uncomplicated abscess treated with I&D.  I personally performed the services described in this documentation, which was scribed in my presence. The recorded information has been reviewed and is accurate.    Arnoldo Hooker, PA-C 02/20/14 1943

## 2014-02-20 NOTE — Discharge Instructions (Signed)
Abscess  Care After  An abscess (also called a boil or furuncle) is an infected area that contains a collection of pus. Signs and symptoms of an abscess include pain, tenderness, redness, or hardness, or you may feel a moveable soft area under your skin. An abscess can occur anywhere in the body. The infection may spread to surrounding tissues causing cellulitis. A cut (incision) by the surgeon was made over your abscess and the pus was drained out. Gauze may have been packed into the space to provide a drain that will allow the cavity to heal from the inside outwards. The boil may be painful for 5 to 7 days. Most people with a boil do not have high fevers. Your abscess, if seen early, may not have localized, and may not have been lanced. If not, another appointment may be required for this if it does not get better on its own or with medications.  HOME CARE INSTRUCTIONS   · Only take over-the-counter or prescription medicines for pain, discomfort, or fever as directed by your caregiver.  · When you bathe, soak and then remove gauze or iodoform packs at least daily or as directed by your caregiver. You may then wash the wound gently with mild soapy water. Repack with gauze or do as your caregiver directs.  SEEK IMMEDIATE MEDICAL CARE IF:   · You develop increased pain, swelling, redness, drainage, or bleeding in the wound site.  · You develop signs of generalized infection including muscle aches, chills, fever, or a general ill feeling.  · An oral temperature above 102° F (38.9° C) develops, not controlled by medication.  See your caregiver for a recheck if you develop any of the symptoms described above. If medications (antibiotics) were prescribed, take them as directed.  Document Released: 05/25/2005 Document Revised: 01/29/2012 Document Reviewed: 01/20/2008  ExitCare® Patient Information ©2014 [employer].

## 2014-02-21 NOTE — ED Provider Notes (Signed)
Medical screening examination/treatment/procedure(s) were performed by non-physician practitioner and as supervising physician I was immediately available for consultation/collaboration.   Megan E Docherty, MD 02/21/14 1041 

## 2014-02-23 ENCOUNTER — Encounter (HOSPITAL_COMMUNITY): Payer: Self-pay | Admitting: Emergency Medicine

## 2014-02-23 ENCOUNTER — Emergency Department (HOSPITAL_COMMUNITY)
Admission: EM | Admit: 2014-02-23 | Discharge: 2014-02-23 | Disposition: A | Discharge status: Home / Self Care | Payer: BC Managed Care – PPO | Attending: Emergency Medicine | Admitting: Emergency Medicine

## 2014-02-23 DIAGNOSIS — Z79899 Other long term (current) drug therapy: Secondary | ICD-10-CM | POA: Insufficient documentation

## 2014-02-23 DIAGNOSIS — Z862 Personal history of diseases of the blood and blood-forming organs and certain disorders involving the immune mechanism: Secondary | ICD-10-CM | POA: Insufficient documentation

## 2014-02-23 DIAGNOSIS — Z8619 Personal history of other infectious and parasitic diseases: Secondary | ICD-10-CM | POA: Insufficient documentation

## 2014-02-23 DIAGNOSIS — Z4801 Encounter for change or removal of surgical wound dressing: Secondary | ICD-10-CM | POA: Insufficient documentation

## 2014-02-23 DIAGNOSIS — I10 Essential (primary) hypertension: Secondary | ICD-10-CM | POA: Insufficient documentation

## 2014-02-23 DIAGNOSIS — Z4800 Encounter for change or removal of nonsurgical wound dressing: Secondary | ICD-10-CM

## 2014-02-23 DIAGNOSIS — E669 Obesity, unspecified: Secondary | ICD-10-CM | POA: Insufficient documentation

## 2014-02-23 NOTE — Discharge Instructions (Signed)
Please be sure to use warm compresses throughout the day and return if you develop redness, warmth, fevers, chills, nausea, vomiting, or develop any new or worsening symptom that you find concerning.

## 2014-02-23 NOTE — ED Provider Notes (Signed)
CSN: [id number]     Arrival date & time 02/23/14  [id number] History   First MD Initiated Contact with Patient 02/23/14 [phone]     Chief Complaint  Patient presents with  . Dressing Change     (Consider location/radiation/quality/duration/timing/severity/associated sxs/prior Treatment) HPI Comments: Patient is a 53 year old female with history of hypertension, hyperlipidemia, and anemia who presents today for packing removal. She was evaluated 2 days ago for an abscess that had been present for years and had it drained. She was prescribed antibiotics which she has been taking. She has kept a band aid on the area. She has not done any warm compresses or warm water soaks. She denies fevers, chills, nausea, vomiting, abdominal pain, generalized malaise, chest pain, or shortness of breath. She states she has not had any issues since the packing was placed.   The history is provided by the patient. No language interpreter was used.    Past Medical History  Diagnosis Date  . Hypertension   . Obesity   . Hyperlipemia   . Anemia   . History of chicken pox    Past Surgical History  Procedure Laterality Date  . No prior surgery     Family History  Problem Relation Age of Onset  . Hypertension Mother   . Prostate cancer Maternal Grandfather   . Cancer Neg Hx   . Diabetes Neg Hx   . Early death Neg Hx   . Stroke Neg Hx   . Colon cancer Neg Hx    History  Substance Use Topics  . Smoking status: Never Smoker   . Smokeless tobacco: Never Used  . Alcohol Use: No   OB History   Grav Para Term Preterm Abortions TAB SAB Ect Mult Living                 Review of Systems  Constitutional: Negative for fever and chills.  Respiratory: Negative for shortness of breath.   Cardiovascular: Negative for chest pain.  Gastrointestinal: Negative for nausea, vomiting and abdominal pain.  Musculoskeletal: Negative for myalgias.  Skin: Positive for wound.  All other systems reviewed and are  negative.      Allergies  Review of patient's allergies indicates no known allergies.  Home Medications   Current Outpatient Rx  Name  Route  Sig  Dispense  Refill  . HYDROcodone-acetaminophen (NORCO/VICODIN) 5-325 MG per tablet   Oral   Take 1-2 tablets by mouth every 4 (four) hours as needed.   12 tablet   0   . lisinopril-hydrochlorothiazide (PRINZIDE,ZESTORETIC) 20-25 MG per tablet   Oral   Take 1 tablet by mouth daily.   30 tablet   1     Patient needs appointment for anymore refills    BP 106/62  Pulse 74  Temp(Src) 98 F (36.7 C) (Oral)  Resp 18  SpO2 100% Physical Exam  Nursing note and vitals reviewed. Constitutional: She is oriented to person, place, and time. She appears well-developed and well-nourished. No distress.  HENT:  Head: Normocephalic and atraumatic.  Right Ear: External ear normal.  Left Ear: External ear normal.  Nose: Nose normal.  Mouth/Throat: Oropharynx is clear and moist.  Eyes: Conjunctivae are normal.  Neck: Normal range of motion.  Cardiovascular: Normal rate, regular rhythm and normal heart sounds.   Pulmonary/Chest: Effort normal and breath sounds normal. No stridor. No respiratory distress. She has no wheezes. She has no rales.  Abdominal: Soft. She exhibits no distension.  Musculoskeletal: Normal range  of motion.  Neurological: She is alert and oriented to person, place, and time. She has normal strength.  Skin: Skin is warm and dry. She is not diaphoretic. No erythema.  Packing in place in left upper chest. Mild surrounding induration, no erythema, fluctuance. No active drainage.   Psychiatric: She has a normal mood and affect. Her behavior is normal.    ED Course  Procedures (including critical care time) Labs Review Labs Reviewed - No data to display Imaging Review No results found.   EKG Interpretation None     Packing REMOVAL Performed by: Junious Silk  Consent: Verbal consent obtained. Consent given by:  patient Required items: required blood products, implants, devices, and special equipment available Time out: Immediately prior to procedure a "time out" was called to verify the correct patient, procedure, equipment, support staff and site/side marked as required.  Location: left chest  Wound Appearance: clean  Packing Removed: 1/4 inch iodoform  Patient tolerance: Patient tolerated the procedure well with no immediate complications.    MDM   Final diagnoses:  Abscess packing removal   Patient presents for packing removal. There appear to have been no interim issues. Patient was encouraged to continue antibiotics as prescribed and begin using warm compresses. Discussed reasons to return to the ED and patient voices understanding. Abscess was redressed in ED. Vital signs stable for discharge. Patient / Family / Caregiver informed of clinical course, understand medical decision-making process, and agree with plan.    Mora Bellman, PA-C 02/23/14 [phone]

## 2014-02-23 NOTE — ED Notes (Signed)
Pt here for wound dressing change from 2 days ago.

## 2014-02-24 NOTE — ED Provider Notes (Signed)
Medical screening examination/treatment/procedure(s) were performed by non-physician practitioner and as supervising physician I was immediately available for consultation/collaboration.    Sullivan Blasing R Andrez Lieurance, MD 02/24/14 0735 

## 2014-03-13 ENCOUNTER — Other Ambulatory Visit: Payer: Self-pay | Admitting: *Deleted

## 2014-03-13 DIAGNOSIS — I10 Essential (primary) hypertension: Secondary | ICD-10-CM

## 2014-03-13 MED ORDER — LISINOPRIL-HYDROCHLOROTHIAZIDE 20-25 MG PO TABS: 1.0000 | ORAL_TABLET | Freq: Every day | ORAL | Status: DC

## 2014-03-13 MED ORDER — SIMVASTATIN 10 MG PO TABS: 10.0000 mg | ORAL_TABLET | Freq: Every day | ORAL | Status: DC

## 2014-06-24 ENCOUNTER — Encounter: Payer: Self-pay | Admitting: Internal Medicine

## 2014-06-24 ENCOUNTER — Ambulatory Visit (INDEPENDENT_AMBULATORY_CARE_PROVIDER_SITE_OTHER): Payer: BC Managed Care – PPO | Admitting: Internal Medicine

## 2014-06-24 VITALS — BP 128/76 | HR 88 | Temp 98.4°F | Wt 160.5 lb

## 2014-06-24 DIAGNOSIS — E785 Hyperlipidemia, unspecified: Secondary | ICD-10-CM

## 2014-06-24 DIAGNOSIS — I10 Essential (primary) hypertension: Secondary | ICD-10-CM

## 2014-06-24 MED ORDER — LISINOPRIL-HYDROCHLOROTHIAZIDE 20-25 MG PO TABS: 1.0000 | ORAL_TABLET | Freq: Every day | ORAL | Status: DC

## 2014-06-24 MED ORDER — SIMVASTATIN 10 MG PO TABS: 10.0000 mg | ORAL_TABLET | Freq: Every day | ORAL | Status: DC

## 2014-06-24 NOTE — Assessment & Plan Note (Signed)
Well controlled Labs reviewed Medication refilled

## 2014-06-24 NOTE — Progress Notes (Signed)
Subjective:    Patient ID: Anita Ramirez, female    DOB: 11/20/1960, 53 y.o.   MRN: [id number]  HPI  Pt presents to the clinic today for a blood pressure check. Her blood pressure today is 128/76. She is on lisinopril-hct. She is tolerating the medication well without side effects. She also needs a refill of her cholesterol medication. Labs reviewed.  Review of Systems      Past Medical History  Diagnosis Date  . Hypertension   . Obesity   . Hyperlipemia   . Anemia   . History of chicken pox     Current Outpatient Prescriptions  Medication Sig Dispense Refill  . lisinopril-hydrochlorothiazide (PRINZIDE,ZESTORETIC) 20-25 MG per tablet Take 1 tablet by mouth daily.  30 tablet  5  . simvastatin (ZOCOR) 10 MG tablet Take 1 tablet (10 mg total) by mouth at bedtime.  30 tablet  5  . HYDROcodone-acetaminophen (NORCO/VICODIN) 5-325 MG per tablet Take 1-2 tablets by mouth every 4 (four) hours as needed.  12 tablet  0   No current facility-administered medications for this visit.    No Known Allergies  Family History  Problem Relation Age of Onset  . Hypertension Mother   . Prostate cancer Maternal Grandfather   . Cancer Neg Hx   . Diabetes Neg Hx   . Early death Neg Hx   . Stroke Neg Hx   . Colon cancer Neg Hx     History   Social History  . Marital Status: Single    Spouse Name: N/A    Number of Children: N/A  . Years of Education: 12   Occupational History  . Not on file.   Social History Main Topics  . Smoking status: Never Smoker   . Smokeless tobacco: Never Used  . Alcohol Use: No  . Drug Use: No  . Sexual Activity: Not Currently   Other Topics Concern  . Not on file   Social History Narrative   Regular exercise-yes   Caffeine Use-no     Constitutional: Denies fever, malaise, fatigue, headache or abrupt weight changes.  Respiratory: Denies difficulty breathing, shortness of breath, cough or sputum production.   Cardiovascular: Denies chest  pain, chest tightness, palpitations or swelling in the hands or feet.  Neurological: Denies dizziness, difficulty with memory, difficulty with speech or problems with balance and coordination.   No other specific complaints in a complete review of systems (except as listed in HPI above).  Objective:   Physical Exam   BP 128/76  Pulse 88  Temp(Src) 98.4 F (36.9 C) (Oral)  Wt 160 lb 8 oz (72.802 kg) Wt Readings from Last 3 Encounters:  06/24/14 160 lb 8 oz (72.802 kg)  02/05/14 162 lb 8 oz (73.71 kg)  11/06/13 169 lb (76.658 kg)    General: Appears her stated age, well developed, well nourished in NAD. Cardiovascular: Normal rate and rhythm. S1,S2 noted.  No murmur, rubs or gallops noted. No JVD or BLE edema. No carotid bruits noted. Pulmonary/Chest: Normal effort and positive vesicular breath sounds. No respiratory distress. No wheezes, rales or ronchi noted.  Neurological: Alert and oriented. Cranial nerves II-XII intact.   BMET    Component Value Date/Time   NA 143 05/08/2013 1004   K 3.5 05/08/2013 1004   CL 109 05/08/2013 1004   CO2 26 05/08/2013 1004   GLUCOSE 104* 05/08/2013 1004   BUN 15 05/08/2013 1004   CREATININE 0.9 05/08/2013 1004   CALCIUM 9.8 05/08/2013 1004  Lipid Panel     Component Value Date/Time   CHOL 160 02/05/2014 1515   TRIG 150.0* 02/05/2014 1515   HDL 45.70 02/05/2014 1515   CHOLHDL 4 02/05/2014 1515   VLDL 30.0 02/05/2014 1515   LDLCALC 84 02/05/2014 1515    CBC    Component Value Date/Time   WBC 5.8 05/08/2013 1004   WBC 6.5 05/27/2012 1113   RBC 3.94 05/08/2013 1004   RBC 3.75 05/27/2012 1113   HGB 11.2* 05/08/2013 1004   HGB 11.0* 05/27/2012 1113   HCT 34.6* 05/08/2013 1004   HCT 33.7* 05/27/2012 1113   PLT 231.0 05/08/2013 1004   PLT 205 05/27/2012 1113   MCV 87.9 05/08/2013 1004   MCV 90.0 05/27/2012 1113   MCH 29.5 05/27/2012 1113   MCHC 32.4 05/08/2013 1004   MCHC 32.7 05/27/2012 1113   RDW 13.7 05/08/2013 1004   RDW 12.2 05/27/2012 1113   LYMPHSABS 3.0  05/27/2012 1113   LYMPHSABS 3.0 06/14/2010 1949   MONOABS 0.5 05/27/2012 1113   MONOABS 0.4 06/14/2010 1949   EOSABS 0.0 05/27/2012 1113   EOSABS 0.1 06/14/2010 1949   BASOSABS 0.0 05/27/2012 1113   BASOSABS 0.0 06/14/2010 1949    Hgb A1C Lab Results  Component Value Date   HGBA1C 4.7 05/08/2013        Assessment & Plan:

## 2014-06-24 NOTE — Progress Notes (Signed)
Pre visit review using our clinic review tool, if applicable. No additional management support is needed unless otherwise documented below in the visit note. 

## 2014-06-24 NOTE — Assessment & Plan Note (Signed)
Well controlled Labs reviewed Medication refilled 

## 2014-06-24 NOTE — Patient Instructions (Addendum)

## 2014-06-25 ENCOUNTER — Telehealth: Payer: Self-pay | Admitting: Internal Medicine

## 2014-06-25 NOTE — Telephone Encounter (Signed)
Relevant patient education mailed to patient.  

## 2014-08-11 ENCOUNTER — Other Ambulatory Visit: Payer: Self-pay | Admitting: Internal Medicine

## 2014-08-11 DIAGNOSIS — Z1231 Encounter for screening mammogram for malignant neoplasm of breast: Secondary | ICD-10-CM

## 2014-09-03 ENCOUNTER — Ambulatory Visit (HOSPITAL_COMMUNITY)
Admission: RE | Admit: 2014-09-03 | Discharge: 2014-09-03 | Disposition: A | Discharge status: Home / Self Care | Payer: Self-pay | Source: Ambulatory Visit | Attending: Internal Medicine | Admitting: Internal Medicine

## 2014-09-03 DIAGNOSIS — Z1231 Encounter for screening mammogram for malignant neoplasm of breast: Secondary | ICD-10-CM

## 2014-10-22 ENCOUNTER — Ambulatory Visit: Payer: Self-pay | Admitting: Internal Medicine

## 2014-11-05 ENCOUNTER — Ambulatory Visit (INDEPENDENT_AMBULATORY_CARE_PROVIDER_SITE_OTHER): Payer: Self-pay | Admitting: Internal Medicine

## 2014-11-05 ENCOUNTER — Encounter: Payer: Self-pay | Admitting: Internal Medicine

## 2014-11-05 VITALS — BP 122/84 | HR 77 | Temp 98.1°F | Wt 162.0 lb

## 2014-11-05 DIAGNOSIS — E669 Obesity, unspecified: Secondary | ICD-10-CM

## 2014-11-05 DIAGNOSIS — E785 Hyperlipidemia, unspecified: Secondary | ICD-10-CM

## 2014-11-05 DIAGNOSIS — I10 Essential (primary) hypertension: Secondary | ICD-10-CM

## 2014-11-05 LAB — LIPID PANEL
CHOLESTEROL: 211 mg/dL — AB (ref 0–200)
HDL: 53.3 mg/dL (ref >39.00)
NonHDL: 157.7
TRIGLYCERIDES: 233 mg/dL — AB (ref 0.0–149.0)
Total CHOL/HDL Ratio: 4
VLDL: 46.6 mg/dL — ABNORMAL HIGH (ref 0.0–40.0)

## 2014-11-05 LAB — COMPREHENSIVE METABOLIC PANEL
ALT: 17 U/L (ref 0–35)
AST: 17 U/L (ref 0–37)
Albumin: 4.3 g/dL (ref 3.5–5.2)
Alkaline Phosphatase: 51 U/L (ref 39–117)
BUN: 14 mg/dL (ref 6–23)
CALCIUM: 9.9 mg/dL (ref 8.4–10.5)
CHLORIDE: 104 meq/L (ref 96–112)
CO2: 26 meq/L (ref 19–32)
CREATININE: 1 mg/dL (ref 0.4–1.2)
GFR: 75.4 mL/min (ref >60.00)
Glucose, Bld: 96 mg/dL (ref 70–99)
Potassium: 2.9 mEq/L — ABNORMAL LOW (ref 3.5–5.1)
Sodium: 140 mEq/L (ref 135–145)
Total Bilirubin: 0.4 mg/dL (ref 0.2–1.2)
Total Protein: 7.5 g/dL (ref 6.0–8.3)

## 2014-11-05 LAB — CBC
HCT: 33.9 % — ABNORMAL LOW (ref 36.0–46.0)
Hemoglobin: 10.6 g/dL — ABNORMAL LOW (ref 12.0–15.0)
MCHC: 31.2 g/dL (ref 30.0–36.0)
MCV: 87.5 fl (ref 78.0–100.0)
PLATELETS: 254 10*3/uL (ref 150.0–400.0)
RBC: 3.87 Mil/uL (ref 3.87–5.11)
RDW: 13.8 % (ref 11.5–15.5)
WBC: 7.7 10*3/uL (ref 4.0–10.5)

## 2014-11-05 LAB — LDL CHOLESTEROL, DIRECT: Direct LDL: 128 mg/dL

## 2014-11-05 NOTE — Progress Notes (Signed)
Pre visit review using our clinic review tool, if applicable. No additional management support is needed unless otherwise documented below in the visit note. 

## 2014-11-05 NOTE — Patient Instructions (Signed)
Fat and Cholesterol Control Diet Fat and cholesterol levels in your blood and organs are influenced by your diet. High levels of fat and cholesterol may lead to diseases of the heart, small and large blood vessels, gallbladder, liver, and pancreas. CONTROLLING FAT AND CHOLESTEROL WITH DIET Although exercise and lifestyle factors are important, your diet is key. That is because certain foods are known to raise cholesterol and others to lower it. The goal is to balance foods for their effect on cholesterol and more importantly, to replace saturated and trans fat with other types of fat, such as monounsaturated fat, polyunsaturated fat, and omega-3 fatty acids. On average, a person should consume no more than 15 to 17 g of saturated fat daily. Saturated and trans fats are considered "bad" fats, and they will raise LDL cholesterol. Saturated fats are primarily found in animal products such as meats, butter, and cream. However, that does not mean you need to give up all your favorite foods. Today, there are good tasting, low-fat, low-cholesterol substitutes for most of the things you like to eat. Choose low-fat or nonfat alternatives. Choose round or loin cuts of red meat. These types of cuts are lowest in fat and cholesterol. Chicken (without the skin), fish, veal, and ground turkey breast are great choices. Eliminate fatty meats, such as hot dogs and salami. Even shellfish have little or no saturated fat. Have a 3 oz (85 g) portion when you eat lean meat, poultry, or fish. Trans fats are also called "partially hydrogenated oils." They are oils that have been scientifically manipulated so that they are solid at room temperature resulting in a longer shelf life and improved taste and texture of foods in which they are added. Trans fats are found in stick margarine, some tub margarines, cookies, crackers, and baked goods.  When baking and cooking, oils are a great substitute for butter. The monounsaturated oils are  especially beneficial since it is believed they lower LDL and raise HDL. The oils you should avoid entirely are saturated tropical oils, such as coconut and palm.  Remember to eat a lot from food groups that are naturally free of saturated and trans fat, including fish, fruit, vegetables, beans, grains (barley, rice, couscous, bulgur wheat), and pasta (without cream sauces).  IDENTIFYING FOODS THAT LOWER FAT AND CHOLESTEROL  Soluble fiber may lower your cholesterol. This type of fiber is found in fruits such as apples, vegetables such as broccoli, potatoes, and carrots, legumes such as beans, peas, and lentils, and grains such as barley. Foods fortified with plant sterols (phytosterol) may also lower cholesterol. You should eat at least 2 g per day of these foods for a cholesterol lowering effect.  Read package labels to identify low-saturated fats, trans fat free, and low-fat foods at the supermarket. Select cheeses that have only 2 to 3 g saturated fat per ounce. Use a heart-healthy tub margarine that is free of trans fats or partially hydrogenated oil. When buying baked goods (cookies, crackers), avoid partially hydrogenated oils. Breads and muffins should be made from whole grains (whole-wheat or whole oat flour, instead of "flour" or "enriched flour"). Buy non-creamy canned soups with reduced salt and no added fats.  FOOD PREPARATION TECHNIQUES  Never deep-fry. If you must fry, either stir-fry, which uses very little fat, or use non-stick cooking sprays. When possible, broil, bake, or roast meats, and steam vegetables. Instead of putting butter or margarine on vegetables, use lemon and herbs, applesauce, and cinnamon (for squash and sweet potatoes). Use nonfat   yogurt, salsa, and low-fat dressings for salads.  LOW-SATURATED FAT / LOW-FAT FOOD SUBSTITUTES Meats / Saturated Fat (g)  Avoid: Steak, marbled (3 oz/85 g) / 11 g  Choose: Steak, lean (3 oz/85 g) / 4 g  Avoid: Hamburger (3 oz/85 g) / 7  g  Choose: Hamburger, lean (3 oz/85 g) / 5 g  Avoid: Ham (3 oz/85 g) / 6 g  Choose: Ham, lean cut (3 oz/85 g) / 2.4 g  Avoid: Chicken, with skin, dark meat (3 oz/85 g) / 4 g  Choose: Chicken, skin removed, dark meat (3 oz/85 g) / 2 g  Avoid: Chicken, with skin, light meat (3 oz/85 g) / 2.5 g  Choose: Chicken, skin removed, light meat (3 oz/85 g) / 1 g Dairy / Saturated Fat (g)  Avoid: Whole milk (1 cup) / 5 g  Choose: Low-fat milk, 2% (1 cup) / 3 g  Choose: Low-fat milk, 1% (1 cup) / 1.5 g  Choose: Skim milk (1 cup) / 0.3 g  Avoid: Hard cheese (1 oz/28 g) / 6 g  Choose: Skim milk cheese (1 oz/28 g) / 2 to 3 g  Avoid: Cottage cheese, 4% fat (1 cup) / 6.5 g  Choose: Low-fat cottage cheese, 1% fat (1 cup) / 1.5 g  Avoid: Ice cream (1 cup) / 9 g  Choose: Sherbet (1 cup) / 2.5 g  Choose: Nonfat frozen yogurt (1 cup) / 0.3 g  Choose: Frozen fruit bar / trace  Avoid: Whipped cream (1 tbs) / 3.5 g  Choose: Nondairy whipped topping (1 tbs) / 1 g Condiments / Saturated Fat (g)  Avoid: Mayonnaise (1 tbs) / 2 g  Choose: Low-fat mayonnaise (1 tbs) / 1 g  Avoid: Butter (1 tbs) / 7 g  Choose: Extra light margarine (1 tbs) / 1 g  Avoid: Coconut oil (1 tbs) / 11.8 g  Choose: Olive oil (1 tbs) / 1.8 g  Choose: Corn oil (1 tbs) / 1.7 g  Choose: Safflower oil (1 tbs) / 1.2 g  Choose: Sunflower oil (1 tbs) / 1.4 g  Choose: Soybean oil (1 tbs) / 2.4 g  Choose: Canola oil (1 tbs) / 1 g Document Released: 11/06/2005 Document Revised: 03/03/2013 Document Reviewed: 02/04/2014 Exit[employer] Patient Information 2015 [employer]. This information is not intended to replace advice given to you by your health care provider. Make sure you discuss any questions you have with your health care provider.  

## 2014-11-05 NOTE — Assessment & Plan Note (Signed)
No issues on Zocor Will repeat lipid profile and CMET today Handout given on low fat diet

## 2014-11-05 NOTE — Progress Notes (Signed)
Subjective:    Patient ID: Anita Ramirez, female    DOB: 03/24/1961, 53 y.o.   MRN: [id number]  HPI  Pt presents to the clinic today to follow up chronic medical conditions.  HTN: BP well controlled on Lisinopril-HCTZ. She has been taking her medication as directed and has not noted any side effects. Her BP today is 122/84.  HLD: She denies myalgias on Zocor. Her last LDL was 84.  Obesity: She has lost 7 lbs in the last 6 months.  Review of Systems      Past Medical History  Diagnosis Date  . Hypertension   . Obesity   . Hyperlipemia   . Anemia   . History of chicken pox     Current Outpatient Prescriptions  Medication Sig Dispense Refill  . lisinopril-hydrochlorothiazide (PRINZIDE,ZESTORETIC) 20-25 MG per tablet Take 1 tablet by mouth daily. 30 tablet 5  . simvastatin (ZOCOR) 10 MG tablet Take 1 tablet (10 mg total) by mouth at bedtime. 30 tablet 5   No current facility-administered medications for this visit.    No Known Allergies  Family History  Problem Relation Age of Onset  . Hypertension Mother   . Prostate cancer Maternal Grandfather   . Cancer Neg Hx   . Diabetes Neg Hx   . Early death Neg Hx   . Stroke Neg Hx   . Colon cancer Neg Hx     History   Social History  . Marital Status: Single    Spouse Name: N/A    Number of Children: N/A  . Years of Education: 12   Occupational History  . Not on file.   Social History Main Topics  . Smoking status: Never Smoker   . Smokeless tobacco: Never Used  . Alcohol Use: No  . Drug Use: No  . Sexual Activity: Not Currently   Other Topics Concern  . Not on file   Social History Narrative   Regular exercise-yes   Caffeine Use-no     Constitutional: Denies fever, malaise, fatigue, headache or abrupt weight changes.  Respiratory: Denies difficulty breathing, shortness of breath, cough or sputum production.   Cardiovascular: Denies chest pain, chest tightness, palpitations or swelling in the  hands or feet.  Gastrointestinal: Denies abdominal pain, bloating, constipation, diarrhea or blood in the stool.  Musculoskeletal: Denies decrease in range of motion, difficulty with gait, muscle pain or joint pain and swelling.  Neurological: Denies dizziness, difficulty with memory, difficulty with speech or problems with balance and coordination.   No other specific complaints in a complete review of systems (except as listed in HPI above).  Objective:   Physical Exam    BP 122/84 mmHg  Pulse 77  Temp(Src) 98.1 F (36.7 C) (Oral)  Wt 162 lb (73.483 kg)  SpO2 98% Wt Readings from Last 3 Encounters:  11/05/14 162 lb (73.483 kg)  06/24/14 160 lb 8 oz (72.802 kg)  02/05/14 162 lb 8 oz (73.71 kg)    General: Appears her stated age, obese but well developed, well nourished in NAD. Cardiovascular: Normal rate and rhythm. S1,S2 noted. ? Slight murmur.  No rubs or gallops noted. No JVD or BLE edema. No carotid bruits noted. Pulmonary/Chest: Normal effort and positive vesicular breath sounds. No respiratory distress. No wheezes, rales or ronchi noted.  Abdomen: Soft and nontender. Normal bowel sounds, no bruits noted. No distention or masses noted.  Musculoskeletal: Normal range of motion. Strength 5/ BUE/BLE. No difficulty with gait.  Neurological: Alert and oriented.  Coordination normal.   BMET    Component Value Date/Time   NA 143 05/08/2013 1004   K 3.5 05/08/2013 1004   CL 109 05/08/2013 1004   CO2 26 05/08/2013 1004   GLUCOSE 104* 05/08/2013 1004   BUN 15 05/08/2013 1004   CREATININE 0.9 05/08/2013 1004   CALCIUM 9.8 05/08/2013 1004    Lipid Panel     Component Value Date/Time   CHOL 160 02/05/2014 1515   TRIG 150.0* 02/05/2014 1515   HDL 45.70 02/05/2014 1515   CHOLHDL 4 02/05/2014 1515   VLDL 30.0 02/05/2014 1515   LDLCALC 84 02/05/2014 1515    CBC    Component Value Date/Time   WBC 5.8 05/08/2013 1004   WBC 6.5 05/27/2012 1113   RBC 3.94 05/08/2013 1004     RBC 3.75 05/27/2012 1113   HGB 11.2* 05/08/2013 1004   HGB 11.0* 05/27/2012 1113   HCT 34.6* 05/08/2013 1004   HCT 33.7* 05/27/2012 1113   PLT 231.0 05/08/2013 1004   PLT 205 05/27/2012 1113   MCV 87.9 05/08/2013 1004   MCV 90.0 05/27/2012 1113   MCH 29.5 05/27/2012 1113   MCHC 32.4 05/08/2013 1004   MCHC 32.7 05/27/2012 1113   RDW 13.7 05/08/2013 1004   RDW 12.2 05/27/2012 1113   LYMPHSABS 3.0 05/27/2012 1113   LYMPHSABS 3.0 06/14/2010 1949   MONOABS 0.5 05/27/2012 1113   MONOABS 0.4 06/14/2010 1949   EOSABS 0.0 05/27/2012 1113   EOSABS 0.1 06/14/2010 1949   BASOSABS 0.0 05/27/2012 1113   BASOSABS 0.0 06/14/2010 1949    Hgb A1C Lab Results  Component Value Date   HGBA1C 4.7 05/08/2013       Assessment & Plan:   Flu shot today  RTC in 6 months for annual physical

## 2014-11-05 NOTE — Assessment & Plan Note (Signed)
She is down 7 lbs Encouraged her to continue to work on diet and exercise

## 2014-11-05 NOTE — Assessment & Plan Note (Signed)
Well controlled on Lisinopril-HCT Will check CBC and CMET today 

## 2014-11-05 NOTE — Addendum Note (Signed)
Addended by: Lorre MunroeBAITY, Kenner Lewan W on: 11/05/2014 09:48 AM   Modules accepted: Level of Service

## 2014-11-06 ENCOUNTER — Other Ambulatory Visit: Payer: Self-pay | Admitting: Internal Medicine

## 2014-11-06 DIAGNOSIS — T502X5A Adverse effect of carbonic-anhydrase inhibitors, benzothiadiazides and other diuretics, initial encounter: Principal | ICD-10-CM

## 2014-11-06 DIAGNOSIS — E876 Hypokalemia: Secondary | ICD-10-CM

## 2014-11-06 MED ORDER — POTASSIUM CHLORIDE ER 10 MEQ PO TBCR: 10.0000 meq | EXTENDED_RELEASE_TABLET | Freq: Every day | ORAL | Status: DC

## 2014-11-25 ENCOUNTER — Other Ambulatory Visit: Payer: Self-pay

## 2014-11-26 ENCOUNTER — Other Ambulatory Visit (INDEPENDENT_AMBULATORY_CARE_PROVIDER_SITE_OTHER): Payer: Self-pay

## 2014-11-26 DIAGNOSIS — T502X5A Adverse effect of carbonic-anhydrase inhibitors, benzothiadiazides and other diuretics, initial encounter: Secondary | ICD-10-CM

## 2014-11-26 DIAGNOSIS — E876 Hypokalemia: Secondary | ICD-10-CM

## 2014-11-26 LAB — POTASSIUM: Potassium: 3.3 mEq/L — ABNORMAL LOW (ref 3.5–5.1)

## 2014-12-04 MED ORDER — POTASSIUM CHLORIDE ER 10 MEQ PO TBCR: 10.0000 meq | EXTENDED_RELEASE_TABLET | Freq: Two times a day (BID) | ORAL | Status: DC

## 2014-12-04 NOTE — Addendum Note (Signed)
Addended by: Roena MaladyEVONTENNO, Demri Poulton Y on: 12/04/2014 10:17 AM   Modules accepted: Orders

## 2015-01-26 ENCOUNTER — Other Ambulatory Visit: Payer: Self-pay | Admitting: Internal Medicine

## 2015-08-03 ENCOUNTER — Ambulatory Visit: Payer: Self-pay | Admitting: Internal Medicine

## 2015-08-10 ENCOUNTER — Ambulatory Visit: Payer: Self-pay | Admitting: Internal Medicine

## 2015-08-10 ENCOUNTER — Other Ambulatory Visit: Payer: Self-pay

## 2015-08-10 DIAGNOSIS — E876 Hypokalemia: Secondary | ICD-10-CM

## 2015-08-10 DIAGNOSIS — T502X5A Adverse effect of carbonic-anhydrase inhibitors, benzothiadiazides and other diuretics, initial encounter: Secondary | ICD-10-CM

## 2015-08-10 DIAGNOSIS — E785 Hyperlipidemia, unspecified: Secondary | ICD-10-CM

## 2015-08-10 MED ORDER — LISINOPRIL-HYDROCHLOROTHIAZIDE 20-25 MG PO TABS: 1.0000 | ORAL_TABLET | Freq: Every day | ORAL | Status: DC

## 2015-08-10 MED ORDER — POTASSIUM CHLORIDE ER 10 MEQ PO TBCR: 10.0000 meq | EXTENDED_RELEASE_TABLET | Freq: Two times a day (BID) | ORAL | Status: DC

## 2015-08-10 MED ORDER — SIMVASTATIN 10 MG PO TABS: 10.0000 mg | ORAL_TABLET | Freq: Every day | ORAL | Status: DC

## 2015-08-24 ENCOUNTER — Other Ambulatory Visit: Payer: Self-pay

## 2015-08-24 DIAGNOSIS — Z1231 Encounter for screening mammogram for malignant neoplasm of breast: Secondary | ICD-10-CM

## 2015-09-09 ENCOUNTER — Ambulatory Visit: Payer: Self-pay

## 2015-10-28 ENCOUNTER — Ambulatory Visit (HOSPITAL_COMMUNITY): Payer: Self-pay

## 2015-11-17 ENCOUNTER — Ambulatory Visit
Admission: RE | Admit: 2015-11-17 | Discharge: 2015-11-17 | Disposition: A | Discharge status: Home / Self Care | Payer: No Typology Code available for payment source | Source: Ambulatory Visit

## 2015-11-17 DIAGNOSIS — Z1231 Encounter for screening mammogram for malignant neoplasm of breast: Secondary | ICD-10-CM

## 2016-12-05 ENCOUNTER — Telehealth: Payer: Self-pay | Admitting: *Deleted

## 2016-12-05 NOTE — Telephone Encounter (Signed)
Call received from patient reporting she "missed a call about [facility].   "I called to make an appointment for mammogram."  Advised we do not do mammograms here.  Last visit here was in 2013.  New patient coordinator denies trying to reach patient.  "I will listen to the message."

## 2016-12-22 ENCOUNTER — Other Ambulatory Visit: Payer: Self-pay | Admitting: Obstetrics and Gynecology

## 2016-12-22 DIAGNOSIS — Z1231 Encounter for screening mammogram for malignant neoplasm of breast: Secondary | ICD-10-CM

## 2017-01-09 ENCOUNTER — Encounter (HOSPITAL_COMMUNITY): Payer: Self-pay | Admitting: *Deleted

## 2017-01-09 ENCOUNTER — Ambulatory Visit (HOSPITAL_COMMUNITY)
Admission: RE | Admit: 2017-01-09 | Discharge: 2017-01-09 | Disposition: A | Discharge status: Home / Self Care | Payer: Self-pay | Source: Ambulatory Visit | Attending: Obstetrics and Gynecology | Admitting: Obstetrics and Gynecology

## 2017-01-09 ENCOUNTER — Ambulatory Visit
Admission: RE | Admit: 2017-01-09 | Discharge: 2017-01-09 | Disposition: A | Discharge status: Home / Self Care | Payer: No Typology Code available for payment source | Source: Ambulatory Visit | Attending: Obstetrics and Gynecology | Admitting: Obstetrics and Gynecology

## 2017-01-09 VITALS — BP 174/112 | Temp 98.2°F | Ht 61.0 in | Wt 167.6 lb

## 2017-01-09 DIAGNOSIS — Z01419 Encounter for gynecological examination (general) (routine) without abnormal findings: Secondary | ICD-10-CM

## 2017-01-09 DIAGNOSIS — Z1231 Encounter for screening mammogram for malignant neoplasm of breast: Secondary | ICD-10-CM

## 2017-01-09 NOTE — Patient Instructions (Signed)
Explained breast self awareness with Anita Ramirez. Let patient know [facility] will cover Pap smears and HPV typing every 5 years unless has a history of abnormal Pap smears. [facility]. Appointment scheduled for Tuesday, January 09, 2017 at 1510. Let patient know will follow up with her within the next couple weeks with results of Pap smear by phone. Informed patient that the Breast Center will follow up with her within the next couple of weeks with results of mammogram by letter or phone. Anita Ramirez verbalized understanding.  Zandrea Kenealy, Kathaleen Maserhristine Poll, RN 3:45 PM

## 2017-01-09 NOTE — Progress Notes (Signed)
No complaints today.   Pap Smear: Pap smear completed today. Last Pap smear was 08/27/2012 in [facility] clinic and normal. Per patient has no history of an abnormal Pap smear. Last Pap smear result is in EPIC.  Physical exam: Breasts Breasts symmetrical. No skin abnormalities bilateral breasts. No nipple retraction bilateral breasts. No nipple discharge bilateral breasts. No lymphadenopathy. No lumps palpated bilateral breasts. No complaints of pain or tenderness on exam. [facility] screening mamm[facility]. Appointment scheduled for Tuesday, January 09, 2017 at 1510.  Pelvic/Bimanual   Ext Genitalia No lesions, no swelling and no discharge observed on external genitalia.         Vagina Vagina pink and normal texture. No lesions or discharge observed in vagina.          Cervix Cervix is present. Cervix pink and of normal texture. No discharge observed.     Uterus Uterus is present and palpable. Uterus in normal position and normal size.        Adnexae Bilateral ovaries present and palpable. No tenderness on palpation.          Rectovaginal No rectal exam completed today since patient had no rectal complaints. No skin abnormalities observed on exam.    Smoking History: Patient has never smoked.  Patient Navigation: Patient education provided. Access to services provided for patient through [facility] program.   Colorectal Cancer Screening: Per patient had a colonoscopy completed 2 years ago. No complaints today. FIT Test given to patient to complete and return to [facility].

## 2017-01-10 LAB — CYTOLOGY - PAP
Adequacy: ABSENT
DIAGNOSIS: NEGATIVE
HPV: NOT DETECTED

## 2017-01-11 ENCOUNTER — Other Ambulatory Visit: Payer: Self-pay

## 2017-01-11 LAB — FECAL OCCULT BLOOD, GUAIAC: Fecal Occult Blood: NEGATIVE

## 2017-01-12 ENCOUNTER — Encounter (HOSPITAL_COMMUNITY): Payer: Self-pay | Admitting: *Deleted

## 2017-01-13 LAB — FECAL OCCULT BLOOD, IMMUNOCHEMICAL: Fecal Occult Bld: NEGATIVE

## 2017-01-15 ENCOUNTER — Telehealth (HOSPITAL_COMMUNITY): Payer: Self-pay | Admitting: *Deleted

## 2017-01-15 ENCOUNTER — Encounter (HOSPITAL_COMMUNITY): Payer: Self-pay | Admitting: *Deleted

## 2017-01-15 NOTE — Telephone Encounter (Signed)
Telephoned patient at home number and advised patient of negative pap smear results. HPV was negative. Next pap smear due in five years. Advised patient FIT Test was negative. Patient voiced understanding.

## 2017-12-24 ENCOUNTER — Ambulatory Visit: Payer: No Typology Code available for payment source | Admitting: Internal Medicine

## 2017-12-26 ENCOUNTER — Other Ambulatory Visit: Payer: Self-pay | Admitting: Internal Medicine

## 2017-12-26 DIAGNOSIS — Z1231 Encounter for screening mammogram for malignant neoplasm of breast: Secondary | ICD-10-CM

## 2018-01-11 ENCOUNTER — Ambulatory Visit
Admission: RE | Admit: 2018-01-11 | Discharge: 2018-01-11 | Disposition: A | Discharge status: Home / Self Care | Payer: BLUE CROSS/BLUE SHIELD | Source: Ambulatory Visit | Attending: Internal Medicine | Admitting: Internal Medicine

## 2018-01-11 DIAGNOSIS — Z1231 Encounter for screening mammogram for malignant neoplasm of breast: Secondary | ICD-10-CM

## 2018-02-07 ENCOUNTER — Encounter: Payer: Self-pay | Admitting: Internal Medicine

## 2018-02-07 ENCOUNTER — Ambulatory Visit: Payer: BLUE CROSS/BLUE SHIELD | Admitting: Internal Medicine

## 2018-02-07 DIAGNOSIS — I10 Essential (primary) hypertension: Secondary | ICD-10-CM | POA: Diagnosis not present

## 2018-02-07 DIAGNOSIS — D649 Anemia, unspecified: Secondary | ICD-10-CM

## 2018-02-07 DIAGNOSIS — E7800 Pure hypercholesterolemia, unspecified: Secondary | ICD-10-CM

## 2018-02-07 DIAGNOSIS — T502X5A Adverse effect of carbonic-anhydrase inhibitors, benzothiadiazides and other diuretics, initial encounter: Secondary | ICD-10-CM | POA: Diagnosis not present

## 2018-02-07 DIAGNOSIS — E876 Hypokalemia: Secondary | ICD-10-CM | POA: Diagnosis not present

## 2018-02-07 MED ORDER — POTASSIUM CHLORIDE ER 10 MEQ PO TBCR: 10.0000 meq | EXTENDED_RELEASE_TABLET | Freq: Two times a day (BID) | ORAL | 2 refills | Status: DC

## 2018-02-07 MED ORDER — LISINOPRIL-HYDROCHLOROTHIAZIDE 20-25 MG PO TABS: 1.0000 | ORAL_TABLET | Freq: Every day | ORAL | 1 refills | Status: DC

## 2018-02-07 NOTE — Patient Instructions (Signed)

## 2018-02-07 NOTE — Progress Notes (Signed)
HPI  Pt presents to the clinic today to reestablish care and for management of the conditions listed below. She has not been seen in > 3 years.  Anemia: She denies s/s of bleeding. She is currently not taking any iron supplement. She denies fatigue or SOB.  HTN: Her BP today is 150/92. She is not taking her Lisinopril HCT or potassium supplement, because she ran out and never came back to follow up.  HLD: She has not had her cholesterol checked in years. She is no longer taking Simvastatin. She does not consume a low fat diet.  Flu: 2016 Tetanus: 2014 Pap Smear: 12/2016 Mammogram: 12/2017 Colon Screening: 06/2013 Vision Screening: as needed Dentist: as needed  Heart murmru Past Medical History:  Diagnosis Date  . Anemia   . History of chicken pox   . Hyperlipemia   . Hypertension   . Obesity     Current Outpatient Medications  Medication Sig Dispense Refill  . lisinopril-hydrochlorothiazide (PRINZIDE,ZESTORETIC) 20-25 MG tablet Take 1 tablet by mouth daily. MUST SCHEDULE OFFICE VISIT MORE FURTHER REFILLS [phone] 30 tablet 1  . potassium chloride (K-DUR) 10 MEQ tablet Take 1 tablet (10 mEq total) by mouth 2 (two) times daily. MUST SCHEDULE OFFICE VISIT MORE FURTHER REFILLS [phone] 60 tablet 2  . simvastatin (ZOCOR) 10 MG tablet Take 1 tablet (10 mg total) by mouth at bedtime. MUST SCHEDULE OFFICE VISIT MORE FURTHER REFILLS [phone] (Patient not taking: Reported on 01/09/2017) 30 tablet 3   No current facility-administered medications for this visit.     No Known Allergies  Family History  Problem Relation Age of Onset  . Hypertension Mother   . Prostate cancer Maternal Grandfather   . Cancer Neg Hx   . Diabetes Neg Hx   . Early death Neg Hx   . Stroke Neg Hx   . Colon cancer Neg Hx   . Breast cancer Neg Hx     Social History   Socioeconomic History  . Marital status: Single    Spouse name: Not on file  . Number of children: Not on file  . Years of  education: 5712  . Highest education level: Not on file  Occupational History  . Not on file  Social Needs  . Financial resource strain: Not on file  . Food insecurity:    Worry: Not on file    Inability: Not on file  . Transportation needs:    Medical: Not on file    Non-medical: Not on file  Tobacco Use  . Smoking status: Never Smoker  . Smokeless tobacco: Never Used  Substance and Sexual Activity  . Alcohol use: No  . Drug use: No  . Sexual activity: Not Currently  Lifestyle  . Physical activity:    Days per week: Not on file    Minutes per session: Not on file  . Stress: Not on file  Relationships  . Social connections:    Talks on phone: Not on file    Gets together: Not on file    Attends religious service: Not on file    Active member of club or organization: Not on file    Attends meetings of clubs or organizations: Not on file    Relationship status: Not on file  . Intimate partner violence:    Fear of current or ex partner: Not on file    Emotionally abused: Not on file    Physically abused: Not on file    Forced sexual activity: Not on  file  Other Topics Concern  . Not on file  Social History Narrative   Regular exercise-yes   Caffeine Use-no    ROS:  Constitutional: Denies fever, malaise, fatigue, headache or abrupt weight changes.  Respiratory: Denies difficulty breathing, shortness of breath, cough or sputum production.   Cardiovascular: Denies chest pain, chest tightness, palpitations or swelling in the hands or feet.  Skin: Denies redness, rashes, lesions or ulcercations.  Neurological: Denies dizziness, difficulty with memory, difficulty with speech or problems with balance and coordination.  Psych: Denies anxiety, depression, SI/HI.  No other specific complaints in a complete review of systems (except as listed in HPI above).  PE:  BP (!) 150/92   Pulse 90   Temp 97.9 F (36.6 C) (Oral)   Ht 5' (1.524 m)   Wt 165 lb (74.8 kg)   LMP  08/13/2014   SpO2 99%   BMI 32.22 kg/m  Wt Readings from Last 3 Encounters:  02/07/18 165 lb (74.8 kg)  01/09/17 167 lb 9.6 oz (76 kg)  11/05/14 162 lb (73.5 kg)    General: Appears her stated age, obese in NAD. Skin: Dry and intact. Cardiovascular: Normal rate and rhythm. S1,S2 noted.  Murmur noted. Pulmonary/Chest: Normal effort and positive vesicular breath sounds. No respiratory distress. No wheezes, rales or ronchi noted.  Neurological: Alert and oriented.  Psychiatric: Mood and affect normal. Behavior is normal. Judgment and thought content normal.     BMET    Component Value Date/Time   NA 140 11/05/2014 0940   K 3.3 (L) 11/26/2014 1021   CL 104 11/05/2014 0940   CO2 26 11/05/2014 0940   GLUCOSE 96 11/05/2014 0940   BUN 14 11/05/2014 0940   CREATININE 1.0 11/05/2014 0940   CALCIUM 9.9 11/05/2014 0940    Lipid Panel     Component Value Date/Time   CHOL 211 (H) 11/05/2014 0940   TRIG 233.0 (H) 11/05/2014 0940   HDL 53.30 11/05/2014 0940   CHOLHDL 4 11/05/2014 0940   VLDL 46.6 (H) 11/05/2014 0940   LDLCALC 84 02/05/2014 1515    CBC    Component Value Date/Time   WBC 7.7 11/05/2014 0940   RBC 3.87 11/05/2014 0940   HGB 10.6 (L) 11/05/2014 0940   HGB 11.0 (L) 05/27/2012 1113   HCT 33.9 (L) 11/05/2014 0940   HCT 33.7 (L) 05/27/2012 1113   PLT 254.0 11/05/2014 0940   PLT 205 05/27/2012 1113   MCV 87.5 11/05/2014 0940   MCV 90.0 05/27/2012 1113   MCH 29.5 05/27/2012 1113   MCHC 31.2 11/05/2014 0940   RDW 13.8 11/05/2014 0940   RDW 12.2 05/27/2012 1113   LYMPHSABS 3.0 05/27/2012 1113   MONOABS 0.5 05/27/2012 1113   EOSABS 0.0 05/27/2012 1113   BASOSABS 0.0 05/27/2012 1113    Hgb A1C Lab Results  Component Value Date   HGBA1C 4.7 05/08/2013     Assessment and Plan:

## 2018-02-08 LAB — COMPREHENSIVE METABOLIC PANEL
ALBUMIN: 4.4 g/dL (ref 3.5–5.2)
ALT: 22 U/L (ref 0–35)
AST: 16 U/L (ref 0–37)
Alkaline Phosphatase: 68 U/L (ref 39–117)
BUN: 18 mg/dL (ref 6–23)
CALCIUM: 9.9 mg/dL (ref 8.4–10.5)
CHLORIDE: 107 meq/L (ref 96–112)
CO2: 27 mEq/L (ref 19–32)
Creatinine, Ser: 1.17 mg/dL (ref 0.40–1.20)
GFR: 61.44 mL/min (ref >60.00)
Glucose, Bld: 126 mg/dL — ABNORMAL HIGH (ref 70–99)
POTASSIUM: 3.3 meq/L — AB (ref 3.5–5.1)
Sodium: 144 mEq/L (ref 135–145)
Total Bilirubin: 0.3 mg/dL (ref 0.2–1.2)
Total Protein: 7.7 g/dL (ref 6.0–8.3)

## 2018-02-08 LAB — LDL CHOLESTEROL, DIRECT: LDL DIRECT: 149 mg/dL

## 2018-02-08 LAB — LIPID PANEL
CHOL/HDL RATIO: 5
CHOLESTEROL: 243 mg/dL — AB (ref 0–200)
HDL: 50.3 mg/dL (ref >39.00)

## 2018-02-09 ENCOUNTER — Other Ambulatory Visit: Payer: Self-pay | Admitting: Internal Medicine

## 2018-02-09 ENCOUNTER — Encounter: Payer: Self-pay | Admitting: Internal Medicine

## 2018-02-09 DIAGNOSIS — E785 Hyperlipidemia, unspecified: Secondary | ICD-10-CM

## 2018-02-09 MED ORDER — SIMVASTATIN 10 MG PO TABS: 10.0000 mg | ORAL_TABLET | Freq: Every day | ORAL | 2 refills | Status: DC

## 2018-02-09 NOTE — Assessment & Plan Note (Signed)
Will obtain CBC at annual exam

## 2018-02-09 NOTE — Assessment & Plan Note (Signed)
CMET and lipid profile today Encouraged her to consume a low fat diet Will see if she needs to restart Simvastatin 

## 2018-02-09 NOTE — Assessment & Plan Note (Signed)
Uncontrolled Restart Lisinopril HCT and Potassium CMET today Discussed DASH diet and exercise for weight loss

## 2018-02-28 ENCOUNTER — Encounter: Payer: Self-pay | Admitting: Internal Medicine

## 2018-02-28 ENCOUNTER — Ambulatory Visit: Payer: BLUE CROSS/BLUE SHIELD | Admitting: Internal Medicine

## 2018-02-28 VITALS — BP 130/78 | HR 95 | Temp 98.5°F | Wt 162.0 lb

## 2018-02-28 DIAGNOSIS — I10 Essential (primary) hypertension: Secondary | ICD-10-CM

## 2018-02-28 NOTE — Patient Instructions (Signed)

## 2018-02-28 NOTE — Assessment & Plan Note (Signed)
Controlled on Lisinopril HCT and Potassium BMET today Reinforced DASH diet and exercise for weight loss  Make an appt for your annual exam

## 2018-02-28 NOTE — Progress Notes (Signed)
Subjective:    Patient ID: Anita Ramirez, female    DOB: 1961/08/16, 57 y.o.   MRN: [id number]  HPI  Pt presents to the clinic today to follow up HTN. At her last visit, she was restarted on Lisinopril-HCT and Potassium. She has been taking the medication as prescribed. She denies adverse side effects. Her BP today is 130/78. There is no ECG on file.  Review of Systems  Past Medical History:  Diagnosis Date  . Anemia   . History of chicken pox   . Hyperlipemia   . Hypertension   . Obesity     Current Outpatient Medications  Medication Sig Dispense Refill  . lisinopril-hydrochlorothiazide (PRINZIDE,ZESTORETIC) 20-25 MG tablet Take 1 tablet by mouth daily. MUST SCHEDULE OFFICE VISIT MORE FURTHER REFILLS [phone] 30 tablet 1  . potassium chloride (K-DUR) 10 MEQ tablet Take 1 tablet (10 mEq total) by mouth 2 (two) times daily. MUST SCHEDULE OFFICE VISIT MORE FURTHER REFILLS [phone] 60 tablet 2  . simvastatin (ZOCOR) 10 MG tablet Take 1 tablet (10 mg total) by mouth at bedtime. MUST SCHEDULE OFFICE VISIT MORE FURTHER REFILLS [phone] 30 tablet 2   No current facility-administered medications for this visit.     No Known Allergies  Family History  Problem Relation Age of Onset  . Hypertension Mother   . Prostate cancer Maternal Grandfather   . Cancer Neg Hx   . Diabetes Neg Hx   . Early death Neg Hx   . Stroke Neg Hx   . Colon cancer Neg Hx   . Breast cancer Neg Hx     Social History   Socioeconomic History  . Marital status: Single    Spouse name: Not on file  . Number of children: Not on file  . Years of education: 43  . Highest education level: Not on file  Occupational History  . Not on file  Social Needs  . Financial resource strain: Not on file  . Food insecurity:    Worry: Not on file    Inability: Not on file  . Transportation needs:    Medical: Not on file    Non-medical: Not on file  Tobacco Use  . Smoking status: Never Smoker  .  Smokeless tobacco: Never Used  Substance and Sexual Activity  . Alcohol use: No  . Drug use: No  . Sexual activity: Not Currently  Lifestyle  . Physical activity:    Days per week: Not on file    Minutes per session: Not on file  . Stress: Not on file  Relationships  . Social connections:    Talks on phone: Not on file    Gets together: Not on file    Attends religious service: Not on file    Active member of club or organization: Not on file    Attends meetings of clubs or organizations: Not on file    Relationship status: Not on file  . Intimate partner violence:    Fear of current or ex partner: Not on file    Emotionally abused: Not on file    Physically abused: Not on file    Forced sexual activity: Not on file  Other Topics Concern  . Not on file  Social History Narrative   Regular exercise-yes   Caffeine Use-no     Constitutional: Denies fever, malaise, fatigue, headache or abrupt weight changes.  Respiratory: Denies difficulty breathing, shortness of breath, cough or sputum production.   Cardiovascular: Denies chest pain, chest  tightness, palpitations or swelling in the hands or feet.  Neurological: Denies dizziness, difficulty with memory, difficulty with speech or problems with balance and coordination.    No other specific complaints in a complete review of systems (except as listed in HPI above).     Objective:   Physical Exam   BP 130/78   Pulse 95   Temp 98.5 F (36.9 C) (Oral)   Wt 162 lb (73.5 kg)   LMP 08/13/2014   SpO2 97%   BMI 31.64 kg/m  Wt Readings from Last 3 Encounters:  02/28/18 162 lb (73.5 kg)  02/07/18 165 lb (74.8 kg)  01/09/17 167 lb 9.6 oz (76 kg)    General: Appears her stated age, well developed, well nourished in NAD. Cardiovascular: Normal rate and rhythm. S1,S2 noted.  Murmur noted. No JVD or BLE edema.  Pulmonary/Chest: Normal effort and positive vesicular breath sounds. No respiratory distress. No wheezes, rales or  ronchi noted.   Neurological: Alert and oriented.   BMET    Component Value Date/Time   NA 144 02/07/2018 1440   K 3.3 (L) 02/07/2018 1440   CL 107 02/07/2018 1440   CO2 27 02/07/2018 1440   GLUCOSE 126 (H) 02/07/2018 1440   BUN 18 02/07/2018 1440   CREATININE 1.17 02/07/2018 1440   CALCIUM 9.9 02/07/2018 1440    Lipid Panel     Component Value Date/Time   CHOL 243 (H) 02/07/2018 1440   TRIG (H) 02/07/2018 1440    423.0 Triglyceride is over 400; calculations on Lipids are invalid.   HDL 50.30 02/07/2018 1440   CHOLHDL 5 02/07/2018 1440   VLDL 46.6 (H) 11/05/2014 0940   LDLCALC 84 02/05/2014 1515    CBC    Component Value Date/Time   WBC 7.7 11/05/2014 0940   RBC 3.87 11/05/2014 0940   HGB 10.6 (L) 11/05/2014 0940   HGB 11.0 (L) 05/27/2012 1113   HCT 33.9 (L) 11/05/2014 0940   HCT 33.7 (L) 05/27/2012 1113   PLT 254.0 11/05/2014 0940   PLT 205 05/27/2012 1113   MCV 87.5 11/05/2014 0940   MCV 90.0 05/27/2012 1113   MCH 29.5 05/27/2012 1113   MCHC 31.2 11/05/2014 0940   RDW 13.8 11/05/2014 0940   RDW 12.2 05/27/2012 1113   LYMPHSABS 3.0 05/27/2012 1113   MONOABS 0.5 05/27/2012 1113   EOSABS 0.0 05/27/2012 1113   BASOSABS 0.0 05/27/2012 1113    Hgb A1C Lab Results  Component Value Date   HGBA1C 4.7 05/08/2013           Assessment & Plan:

## 2018-03-01 LAB — BASIC METABOLIC PANEL
BUN: 24 mg/dL — AB (ref 6–23)
CALCIUM: 9.6 mg/dL (ref 8.4–10.5)
CO2: 29 meq/L (ref 19–32)
CREATININE: 1.43 mg/dL — AB (ref 0.40–1.20)
Chloride: 103 mEq/L (ref 96–112)
GFR: 48.73 mL/min — ABNORMAL LOW (ref >60.00)
GLUCOSE: 113 mg/dL — AB (ref 70–99)
Potassium: 3.6 mEq/L (ref 3.5–5.1)
Sodium: 141 mEq/L (ref 135–145)

## 2018-03-01 NOTE — Addendum Note (Signed)
Addended by: Roena MaladyEVONTENNO, Tannis Burstein Y on: 03/01/2018 05:07 PM   Modules accepted: Orders

## 2018-03-11 ENCOUNTER — Other Ambulatory Visit (INDEPENDENT_AMBULATORY_CARE_PROVIDER_SITE_OTHER): Payer: BLUE CROSS/BLUE SHIELD

## 2018-03-11 DIAGNOSIS — I10 Essential (primary) hypertension: Secondary | ICD-10-CM | POA: Diagnosis not present

## 2018-03-11 LAB — BASIC METABOLIC PANEL
BUN: 20 mg/dL (ref 6–23)
CO2: 26 mEq/L (ref 19–32)
Calcium: 9.8 mg/dL (ref 8.4–10.5)
Chloride: 106 mEq/L (ref 96–112)
Creatinine, Ser: 1.11 mg/dL (ref 0.40–1.20)
GFR: 65.26 mL/min (ref >60.00)
GLUCOSE: 109 mg/dL — AB (ref 70–99)
POTASSIUM: 3.5 meq/L (ref 3.5–5.1)
Sodium: 141 mEq/L (ref 135–145)

## 2018-04-11 ENCOUNTER — Other Ambulatory Visit: Payer: Self-pay | Admitting: Internal Medicine

## 2018-04-11 DIAGNOSIS — T502X5A Adverse effect of carbonic-anhydrase inhibitors, benzothiadiazides and other diuretics, initial encounter: Principal | ICD-10-CM

## 2018-04-11 DIAGNOSIS — E876 Hypokalemia: Secondary | ICD-10-CM

## 2018-07-04 ENCOUNTER — Encounter: Payer: BLUE CROSS/BLUE SHIELD | Admitting: Internal Medicine

## 2018-07-11 ENCOUNTER — Ambulatory Visit (INDEPENDENT_AMBULATORY_CARE_PROVIDER_SITE_OTHER): Payer: BLUE CROSS/BLUE SHIELD | Admitting: Internal Medicine

## 2018-07-11 ENCOUNTER — Other Ambulatory Visit: Payer: Self-pay | Admitting: Internal Medicine

## 2018-07-11 VITALS — BP 136/80 | HR 78 | Temp 98.2°F | Ht 59.75 in | Wt 161.0 lb

## 2018-07-11 DIAGNOSIS — E785 Hyperlipidemia, unspecified: Secondary | ICD-10-CM

## 2018-07-11 DIAGNOSIS — Z0000 Encounter for general adult medical examination without abnormal findings: Secondary | ICD-10-CM

## 2018-07-11 DIAGNOSIS — E559 Vitamin D deficiency, unspecified: Secondary | ICD-10-CM

## 2018-07-11 DIAGNOSIS — I10 Essential (primary) hypertension: Secondary | ICD-10-CM | POA: Diagnosis not present

## 2018-07-11 DIAGNOSIS — E7800 Pure hypercholesterolemia, unspecified: Secondary | ICD-10-CM | POA: Diagnosis not present

## 2018-07-11 DIAGNOSIS — E876 Hypokalemia: Secondary | ICD-10-CM

## 2018-07-11 DIAGNOSIS — T502X5A Adverse effect of carbonic-anhydrase inhibitors, benzothiadiazides and other diuretics, initial encounter: Secondary | ICD-10-CM

## 2018-07-11 LAB — CBC
HCT: 31.1 % — ABNORMAL LOW (ref 36.0–46.0)
Hemoglobin: 10.1 g/dL — ABNORMAL LOW (ref 12.0–15.0)
MCHC: 32.4 g/dL (ref 30.0–36.0)
MCV: 87.5 fl (ref 78.0–100.0)
Platelets: 245 10*3/uL (ref 150.0–400.0)
RBC: 3.56 Mil/uL — ABNORMAL LOW (ref 3.87–5.11)
RDW: 12.9 % (ref 11.5–15.5)
WBC: 6 10*3/uL (ref 4.0–10.5)

## 2018-07-11 LAB — LIPID PANEL
CHOL/HDL RATIO: 5
Cholesterol: 238 mg/dL — ABNORMAL HIGH (ref 0–200)
HDL: 52.1 mg/dL (ref >39.00)
LDL Cholesterol: 149 mg/dL — ABNORMAL HIGH (ref 0–99)
NONHDL: 186.34
TRIGLYCERIDES: 188 mg/dL — AB (ref 0.0–149.0)
VLDL: 37.6 mg/dL (ref 0.0–40.0)

## 2018-07-11 LAB — COMPREHENSIVE METABOLIC PANEL
ALK PHOS: 56 U/L (ref 39–117)
ALT: 14 U/L (ref 0–35)
AST: 15 U/L (ref 0–37)
Albumin: 4.5 g/dL (ref 3.5–5.2)
BILIRUBIN TOTAL: 0.5 mg/dL (ref 0.2–1.2)
BUN: 19 mg/dL (ref 6–23)
CALCIUM: 10.1 mg/dL (ref 8.4–10.5)
CO2: 28 mEq/L (ref 19–32)
CREATININE: 1.19 mg/dL (ref 0.40–1.20)
Chloride: 103 mEq/L (ref 96–112)
GFR: 60.16 mL/min (ref >60.00)
Glucose, Bld: 102 mg/dL — ABNORMAL HIGH (ref 70–99)
Potassium: 3.5 mEq/L (ref 3.5–5.1)
Sodium: 140 mEq/L (ref 135–145)
TOTAL PROTEIN: 7.8 g/dL (ref 6.0–8.3)

## 2018-07-11 LAB — VITAMIN D 25 HYDROXY (VIT D DEFICIENCY, FRACTURES): VITD: 14.34 ng/mL — ABNORMAL LOW (ref 30.00–100.00)

## 2018-07-11 MED ORDER — VITAMIN D (ERGOCALCIFEROL) 1.25 MG (50000 UNIT) PO CAPS: 50000.0000 [IU] | ORAL_CAPSULE | ORAL | 0 refills | Status: DC

## 2018-07-11 MED ORDER — LISINOPRIL-HYDROCHLOROTHIAZIDE 20-25 MG PO TABS: 1.0000 | ORAL_TABLET | Freq: Every day | ORAL | 3 refills | Status: DC

## 2018-07-11 MED ORDER — POTASSIUM CHLORIDE ER 10 MEQ PO TBCR: 10.0000 meq | EXTENDED_RELEASE_TABLET | Freq: Two times a day (BID) | ORAL | 3 refills | Status: DC

## 2018-07-11 MED ORDER — SIMVASTATIN 10 MG PO TABS: 10.0000 mg | ORAL_TABLET | Freq: Every day | ORAL | 3 refills | Status: DC

## 2018-07-11 NOTE — Patient Instructions (Signed)
Health Maintenance for Postmenopausal Women Menopause is a normal process in which your reproductive ability comes to an end. This process happens gradually over a span of months to years, usually between the ages of 22 and 9. Menopause is complete when you have missed 12 consecutive menstrual periods. It is important to talk with your health care provider about some of the most common conditions that affect postmenopausal women, such as heart disease, cancer, and bone loss (osteoporosis). Adopting a healthy lifestyle and getting preventive care can help to promote your health and wellness. Those actions can also lower your chances of developing some of these common conditions. What should I know about menopause? During menopause, you may experience a number of symptoms, such as:  Moderate-to-severe hot flashes.  Night sweats.  Decrease in sex drive.  Mood swings.  Headaches.  Tiredness.  Irritability.  Memory problems.  Insomnia.  Choosing to treat or not to treat menopausal changes is an individual decision that you make with your health care provider. What should I know about hormone replacement therapy and supplements? Hormone therapy products are effective for treating symptoms that are associated with menopause, such as hot flashes and night sweats. Hormone replacement carries certain risks, especially as you become older. If you are thinking about using estrogen or estrogen with progestin treatments, discuss the benefits and risks with your health care provider. What should I know about heart disease and stroke? Heart disease, heart attack, and stroke become more likely as you age. This may be due, in part, to the hormonal changes that your body experiences during menopause. These can affect how your body processes dietary fats, triglycerides, and cholesterol. Heart attack and stroke are both medical emergencies. There are many things that you can do to help prevent heart disease  and stroke:  Have your blood pressure checked at least every 1-2 years. High blood pressure causes heart disease and increases the risk of stroke.  If you are 53-22 years old, ask your health care provider if you should take aspirin to prevent a heart attack or a stroke.  Do not use any tobacco products, including cigarettes, chewing tobacco, or electronic cigarettes. If you need help quitting, ask your health care provider.  It is important to eat a healthy diet and maintain a healthy weight. ? Be sure to include plenty of vegetables, fruits, low-fat dairy products, and lean protein. ? Avoid eating foods that are high in solid fats, added sugars, or salt (sodium).  Get regular exercise. This is one of the most important things that you can do for your health. ? Try to exercise for at least 150 minutes each week. The type of exercise that you do should increase your heart rate and make you sweat. This is known as moderate-intensity exercise. ? Try to do strengthening exercises at least twice each week. Do these in addition to the moderate-intensity exercise.  Know your numbers.Ask your health care provider to check your cholesterol and your blood glucose. Continue to have your blood tested as directed by your health care provider.  What should I know about cancer screening? There are several types of cancer. Take the following steps to reduce your risk and to catch any cancer development as early as possible. Breast Cancer  Practice breast self-awareness. ? This means understanding how your breasts normally appear and feel. ? It also means doing regular breast self-exams. Let your health care provider know about any changes, no matter how small.  If you are 40  or older, have a clinician do a breast exam (clinical breast exam or CBE) every year. Depending on your age, family history, and medical history, it may be recommended that you also have a yearly breast X-ray (mammogram).  If you  have a family history of breast cancer, talk with your health care provider about genetic screening.  If you are at high risk for breast cancer, talk with your health care provider about having an MRI and a mammogram every year.  Breast cancer (BRCA) gene test is recommended for women who have family members with BRCA-related cancers. Results of the assessment will determine the need for genetic counseling and BRCA1 and for BRCA2 testing. BRCA-related cancers include these types: ? Breast. This occurs in males or females. ? Ovarian. ? Tubal. This may also be called fallopian tube cancer. ? Cancer of the abdominal or pelvic lining (peritoneal cancer). ? Prostate. ? Pancreatic.  Cervical, Uterine, and Ovarian Cancer Your health care provider may recommend that you be screened regularly for cancer of the pelvic organs. These include your ovaries, uterus, and vagina. This screening involves a pelvic exam, which includes checking for microscopic changes to the surface of your cervix (Pap test).  For women ages 21-65, health care providers may recommend a pelvic exam and a Pap test every three years. For women ages 79-65, they may recommend the Pap test and pelvic exam, combined with testing for human papilloma virus (HPV), every five years. Some types of HPV increase your risk of cervical cancer. Testing for HPV may also be done on women of any age who have unclear Pap test results.  Other health care providers may not recommend any screening for nonpregnant women who are considered low risk for pelvic cancer and have no symptoms. Ask your health care provider if a screening pelvic exam is right for you.  If you have had past treatment for cervical cancer or a condition that could lead to cancer, you need Pap tests and screening for cancer for at least 20 years after your treatment. If Pap tests have been discontinued for you, your risk factors (such as having a new sexual partner) need to be  reassessed to determine if you should start having screenings again. Some women have medical problems that increase the chance of getting cervical cancer. In these cases, your health care provider may recommend that you have screening and Pap tests more often.  If you have a family history of uterine cancer or ovarian cancer, talk with your health care provider about genetic screening.  If you have vaginal bleeding after reaching menopause, tell your health care provider.  There are currently no reliable tests available to screen for ovarian cancer.  Lung Cancer Lung cancer screening is recommended for adults 69-62 years old who are at high risk for lung cancer because of a history of smoking. A yearly low-dose CT scan of the lungs is recommended if you:  Currently smoke.  Have a history of at least 30 pack-years of smoking and you currently smoke or have quit within the past 15 years. A pack-year is smoking an average of one pack of cigarettes per day for one year.  Yearly screening should:  Continue until it has been 15 years since you quit.  Stop if you develop a health problem that would prevent you from having lung cancer treatment.  Colorectal Cancer  This type of cancer can be detected and can often be prevented.  Routine colorectal cancer screening usually begins at  age 42 and continues through age 45.  If you have risk factors for colon cancer, your health care provider may recommend that you be screened at an earlier age.  If you have a family history of colorectal cancer, talk with your health care provider about genetic screening.  Your health care provider may also recommend using home test kits to check for hidden blood in your stool.  A small camera at the end of a tube can be used to examine your colon directly (sigmoidoscopy or colonoscopy). This is done to check for the earliest forms of colorectal cancer.  Direct examination of the colon should be repeated every  5-10 years until age 71. However, if early forms of precancerous polyps or small growths are found or if you have a family history or genetic risk for colorectal cancer, you may need to be screened more often.  Skin Cancer  Check your skin from head to toe regularly.  Monitor any moles. Be sure to tell your health care provider: ? About any new moles or changes in moles, especially if there is a change in a mole's shape or color. ? If you have a mole that is larger than the size of a pencil eraser.  If any of your family members has a history of skin cancer, especially at a young age, talk with your health care provider about genetic screening.  Always use sunscreen. Apply sunscreen liberally and repeatedly throughout the day.  Whenever you are outside, protect yourself by wearing long sleeves, pants, a wide-brimmed hat, and sunglasses.  What should I know about osteoporosis? Osteoporosis is a condition in which bone destruction happens more quickly than new bone creation. After menopause, you may be at an increased risk for osteoporosis. To help prevent osteoporosis or the bone fractures that can happen because of osteoporosis, the following is recommended:  If you are 46-71 years old, get at least 1,000 mg of calcium and at least 600 mg of vitamin D per day.  If you are older than age 55 but younger than age 65, get at least 1,200 mg of calcium and at least 600 mg of vitamin D per day.  If you are older than age 54, get at least 1,200 mg of calcium and at least 800 mg of vitamin D per day.  Smoking and excessive alcohol intake increase the risk of osteoporosis. Eat foods that are rich in calcium and vitamin D, and do weight-bearing exercises several times each week as directed by your health care provider. What should I know about how menopause affects my mental health? Depression may occur at any age, but it is more common as you become older. Common symptoms of depression  include:  Low or sad mood.  Changes in sleep patterns.  Changes in appetite or eating patterns.  Feeling an overall lack of motivation or enjoyment of activities that you previously enjoyed.  Frequent crying spells.  Talk with your health care provider if you think that you are experiencing depression. What should I know about immunizations? It is important that you get and maintain your immunizations. These include:  Tetanus, diphtheria, and pertussis (Tdap) booster vaccine.  Influenza every year before the flu season begins.  Pneumonia vaccine.  Shingles vaccine.  Your health care provider may also recommend other immunizations. This information is not intended to replace advice given to you by your health care provider. Make sure you discuss any questions you have with your health care provider. Document Released: 12/29/2005  Document Revised: 05/26/2016 Document Reviewed: 08/10/2015 Elsevier Interactive Patient Education  2018 [employer]vier Inc[employer]  

## 2018-07-13 ENCOUNTER — Encounter: Payer: Self-pay | Admitting: Internal Medicine

## 2018-07-13 NOTE — Progress Notes (Signed)
Subjective:    Patient ID: Anita Ramirez, female    DOB: [birth year], 57 y.o.   MRN: [id number]  HPI  Pt presents to the clinic today for her annual exam. She is also due to follow up chronic conditions.  HTN: Her BP today is 136/80. She is taking Lisinopril HCT and potassium as prescribed. There is no ECG on file  HLD: Her last LDL was 149, 01/2018. She denies myalgias on Simvastatin. She has been trying to consume a low fat diet.   Flu: 2016 Tetanus: 2014 Pap Smear: 12/2016 Mammogram: 12/2017 Colon Screening: 2014 Vision Screening: as needed Dentist: as needed  Review of Systems      Past Medical History:  Diagnosis Date  . Anemia   . History of chicken pox   . Hyperlipemia   . Hypertension   . Obesity     Current Outpatient Medications  Medication Sig Dispense Refill  . lisinopril-hydrochlorothiazide (PRINZIDE,ZESTORETIC) 20-25 MG tablet Take 1 tablet by mouth daily. MUST SCHEDULE ANNUAL PHYSICAL EXAM 90 tablet 3  . potassium chloride (K-DUR) 10 MEQ tablet Take 1 tablet (10 mEq total) by mouth 2 (two) times daily. 180 tablet 3  . simvastatin (ZOCOR) 10 MG tablet Take 1 tablet (10 mg total) by mouth at bedtime. 90 tablet 3  . Vitamin D, Ergocalciferol, (DRISDOL) 50000 units CAPS capsule Take 1 capsule (50,000 Units total) by mouth every 7 (seven) days. 12 capsule 0   No current facility-administered medications for this visit.     No Known Allergies  Family History  Problem Relation Age of Onset  . Hypertension Mother   . Prostate cancer Maternal Grandfather   . Cancer Neg Hx   . Diabetes Neg Hx   . Early death Neg Hx   . Stroke Neg Hx   . Colon cancer Neg Hx   . Breast cancer Neg Hx     Social History   Socioeconomic History  . Marital status: Single    Spouse name: Not on file  . Number of children: Not on file  . Years of education: 9  . Highest education level: Not on file  Occupational History  . Not on file  Social Needs  . Financial  resource strain: Not on file  . Food insecurity:    Worry: Not on file    Inability: Not on file  . Transportation needs:    Medical: Not on file    Non-medical: Not on file  Tobacco Use  . Smoking status: Never Smoker  . Smokeless tobacco: Never Used  Substance and Sexual Activity  . Alcohol use: No  . Drug use: No  . Sexual activity: Not Currently  Lifestyle  . Physical activity:    Days per week: Not on file    Minutes per session: Not on file  . Stress: Not on file  Relationships  . Social connections:    Talks on phone: Not on file    Gets together: Not on file    Attends religious service: Not on file    Active member of club or organization: Not on file    Attends meetings of clubs or organizations: Not on file    Relationship status: Not on file  . Intimate partner violence:    Fear of current or ex partner: Not on file    Emotionally abused: Not on file    Physically abused: Not on file    Forced sexual activity: Not on file  Other Topics Concern  .  Not on file  Social History Narrative   Regular exercise-yes   Caffeine Use-no     Constitutional: Denies fever, malaise, fatigue, headache or abrupt weight changes.  HEENT: Denies eye pain, eye redness, ear pain, ringing in the ears, wax buildup, runny nose, nasal congestion, bloody nose, or sore throat. Respiratory: Denies difficulty breathing, shortness of breath, cough or sputum production.   Cardiovascular: Denies chest pain, chest tightness, palpitations or swelling in the hands or feet.  Gastrointestinal: Denies abdominal pain, bloating, constipation, diarrhea or blood in the stool.  GU: Denies urgency, frequency, pain with urination, burning sensation, blood in urine, odor or discharge. Musculoskeletal: Denies decrease in range of motion, difficulty with gait, muscle pain or joint pain and swelling.  Skin: Denies redness, rashes, lesions or ulcercations.  Neurological: Denies dizziness, difficulty with  memory, difficulty with speech or problems with balance and coordination.  Psych: Denies anxiety, depression, SI/HI.  No other specific complaints in a complete review of systems (except as listed in HPI above).  Objective:   Physical Exam  BP 136/80   Pulse 78   Temp 98.2 F (36.8 C) (Oral)   Ht 4' 11.75" (1.518 m)   Wt 161 lb (73 kg)   LMP 08/13/2014   SpO2 98%   BMI 31.71 kg/m  Wt Readings from Last 3 Encounters:  07/11/18 161 lb (73 kg)  02/28/18 162 lb (73.5 kg)  02/07/18 165 lb (74.8 kg)    General: Appears her stated age, well developed, well nourished in NAD. Skin: Warm, dry and intact.  HEENT: Head: normal shape and size; Eyes: sclera white, no icterus, conjunctiva pink, PERRLA and EOMs intact; Ears: Tm's gray and intact, normal light reflex; Throat/Mouth: Teeth present, mucosa pink and moist, no exudate, lesions or ulcerations noted.  Neck:  Neck supple, trachea midline. No masses, lumps or thyromegaly present.  Cardiovascular: Normal rate and rhythm. S1,S2 noted.  No murmur, rubs or gallops noted. No JVD or BLE edema. No carotid bruits noted. Pulmonary/Chest: Normal effort and positive vesicular breath sounds. No respiratory distress. No wheezes, rales or ronchi noted.  Abdomen: Soft and nontender. Normal bowel sounds. No distention or masses noted. Liver, spleen and kidneys non palpable. Musculoskeletal: Strength 5/5 BUE/BLE. No difficulty with gait.  Neurological: Alert and oriented. Cranial nerves II-XII grossly intact. Coordination normal.  Psychiatric: Mood and affect normal. Behavior is normal. Judgment and thought content normal.    BMET    Component Value Date/Time   NA 140 07/11/2018 1245   K 3.5 07/11/2018 1245   CL 103 07/11/2018 1245   CO2 28 07/11/2018 1245   GLUCOSE 102 (H) 07/11/2018 1245   BUN 19 07/11/2018 1245   CREATININE 1.19 07/11/2018 1245   CALCIUM 10.1 07/11/2018 1245    Lipid Panel     Component Value Date/Time   CHOL 238 (H)  07/11/2018 1245   TRIG 188.0 (H) 07/11/2018 1245   HDL 52.10 07/11/2018 1245   CHOLHDL 5 07/11/2018 1245   VLDL 37.6 07/11/2018 1245   LDLCALC 149 (H) 07/11/2018 1245    CBC    Component Value Date/Time   WBC 6.0 07/11/2018 1245   RBC 3.56 (L) 07/11/2018 1245   HGB 10.1 (L) 07/11/2018 1245   HGB 11.0 (L) 05/27/2012 1113   HCT 31.1 (L) 07/11/2018 1245   HCT 33.7 (L) 05/27/2012 1113   PLT 245.0 07/11/2018 1245   PLT 205 05/27/2012 1113   MCV 87.5 07/11/2018 1245   MCV 90.0 05/27/2012 1113  MCH 29.5 05/27/2012 1113   MCHC 32.4 07/11/2018 1245   RDW 12.9 07/11/2018 1245   RDW 12.2 05/27/2012 1113   LYMPHSABS 3.0 05/27/2012 1113   MONOABS 0.5 05/27/2012 1113   EOSABS 0.0 05/27/2012 1113   BASOSABS 0.0 05/27/2012 1113    Hgb A1C Lab Results  Component Value Date   HGBA1C 4.7 05/08/2013            Assessment & Plan:   Preventative Health Maintenance:  Encouraged her to get a flu shot in the fall Tetanus UTD Pap smear and mammogram UTD Colon screening UTD Encouraged her to consume a balanced diet and exercise regimen Advised her to see an eye doctor and dentist annually Will check CBC, CMET, Lipid and Vit D today  RTC in 1 year, sooner if needed Nicki Reaper, NP

## 2018-07-13 NOTE — Assessment & Plan Note (Signed)
Controlled on Lisinopril HCT and Potassium CBC and CMET today Reinforced DASH diet

## 2018-07-13 NOTE — Assessment & Plan Note (Signed)
CMET and lipid profile today Encouraged her to consume a low fat diet Continue Simvastatin for now, will adjust if needed based on labs 

## 2018-10-21 ENCOUNTER — Other Ambulatory Visit: Payer: BLUE CROSS/BLUE SHIELD

## 2018-10-24 ENCOUNTER — Other Ambulatory Visit: Payer: BLUE CROSS/BLUE SHIELD

## 2018-10-31 ENCOUNTER — Other Ambulatory Visit (INDEPENDENT_AMBULATORY_CARE_PROVIDER_SITE_OTHER): Payer: BLUE CROSS/BLUE SHIELD

## 2018-10-31 DIAGNOSIS — E785 Hyperlipidemia, unspecified: Secondary | ICD-10-CM | POA: Diagnosis not present

## 2018-10-31 DIAGNOSIS — E559 Vitamin D deficiency, unspecified: Secondary | ICD-10-CM

## 2018-10-31 LAB — COMPREHENSIVE METABOLIC PANEL
ALT: 17 U/L (ref 0–35)
AST: 16 U/L (ref 0–37)
Albumin: 4.4 g/dL (ref 3.5–5.2)
Alkaline Phosphatase: 47 U/L (ref 39–117)
BUN: 26 mg/dL — ABNORMAL HIGH (ref 6–23)
CO2: 28 mEq/L (ref 19–32)
Calcium: 9.9 mg/dL (ref 8.4–10.5)
Chloride: 106 mEq/L (ref 96–112)
Creatinine, Ser: 1.13 mg/dL (ref 0.40–1.20)
GFR: 63.79 mL/min (ref >60.00)
GLUCOSE: 105 mg/dL — AB (ref 70–99)
Potassium: 3.7 mEq/L (ref 3.5–5.1)
Sodium: 142 mEq/L (ref 135–145)
Total Bilirubin: 0.4 mg/dL (ref 0.2–1.2)
Total Protein: 7.2 g/dL (ref 6.0–8.3)

## 2018-10-31 LAB — LIPID PANEL
Cholesterol: 211 mg/dL — ABNORMAL HIGH (ref 0–200)
HDL: 55.6 mg/dL (ref >39.00)
LDL Cholesterol: 130 mg/dL — ABNORMAL HIGH (ref 0–99)
NonHDL: 154.99
Total CHOL/HDL Ratio: 4
Triglycerides: 125 mg/dL (ref 0.0–149.0)
VLDL: 25 mg/dL (ref 0.0–40.0)

## 2018-10-31 LAB — VITAMIN D 25 HYDROXY (VIT D DEFICIENCY, FRACTURES): VITD: 28.84 ng/mL — ABNORMAL LOW (ref 30.00–100.00)

## 2018-12-24 ENCOUNTER — Other Ambulatory Visit: Payer: Self-pay | Admitting: Internal Medicine

## 2018-12-24 DIAGNOSIS — Z1231 Encounter for screening mammogram for malignant neoplasm of breast: Secondary | ICD-10-CM

## 2019-01-22 ENCOUNTER — Ambulatory Visit: Payer: BLUE CROSS/BLUE SHIELD

## 2019-01-24 ENCOUNTER — Other Ambulatory Visit: Payer: Self-pay

## 2019-01-24 ENCOUNTER — Emergency Department (HOSPITAL_COMMUNITY)
Admission: EM | Admit: 2019-01-24 | Discharge: 2019-01-24 | Disposition: A | Discharge status: Home / Self Care | Payer: Self-pay | Attending: Emergency Medicine | Admitting: Emergency Medicine

## 2019-01-24 ENCOUNTER — Emergency Department (HOSPITAL_COMMUNITY): Payer: Self-pay

## 2019-01-24 ENCOUNTER — Encounter (HOSPITAL_COMMUNITY): Payer: Self-pay | Admitting: Emergency Medicine

## 2019-01-24 DIAGNOSIS — Z79899 Other long term (current) drug therapy: Secondary | ICD-10-CM | POA: Diagnosis not present

## 2019-01-24 DIAGNOSIS — I10 Essential (primary) hypertension: Secondary | ICD-10-CM | POA: Diagnosis not present

## 2019-01-24 DIAGNOSIS — M79672 Pain in left foot: Secondary | ICD-10-CM | POA: Diagnosis not present

## 2019-01-24 DIAGNOSIS — M109 Gout, unspecified: Secondary | ICD-10-CM | POA: Diagnosis not present

## 2019-01-24 MED ORDER — PREDNISONE 10 MG PO TABS: 40.0000 mg | ORAL_TABLET | Freq: Every day | ORAL | 0 refills | Status: DC

## 2019-01-24 MED ORDER — INDOMETHACIN 25 MG PO CAPS: 25.0000 mg | ORAL_CAPSULE | Freq: Three times a day (TID) | ORAL | 0 refills | Status: DC | PRN

## 2019-01-24 MED ORDER — HYDROCODONE-ACETAMINOPHEN 5-325 MG PO TABS
1.0000 | ORAL_TABLET | Freq: Once | ORAL | Status: AC
  Administered 2019-01-24: 1 via ORAL
  Filled 2019-01-24: qty 1

## 2019-01-24 NOTE — ED Provider Notes (Signed)
[facility]-EMERGENCY DEPT Provider Note   CSN: [id number] Arrival date & time: 01/24/19  1208    History   Chief Complaint Chief Complaint  Patient presents with  . Foot Swelling    HPI Anita Ramirez is a 58 y.o. female who presents to the ED with left foot pain. The pain started a week ago and has gotten worse. 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 Patient thinks she has gout.      The history is provided by the patient. No language interpreter was used.    Past Medical History:  Diagnosis Date  . Anemia   . History of chicken pox   . Hyperlipemia   . Hypertension   . Obesity     Patient Active Problem List   Diagnosis Date Noted  . Hyperlipemia 11/06/2013  . Hypertension 05/08/2013    Past Surgical History:  Procedure Laterality Date  . no prior surgery       OB History    Gravida  0   Para  0   Term  0   Preterm  0   AB  0   Living  0     SAB  0   TAB  0   Ectopic  0   Multiple  0   Live Births  0            Home Medications    Prior to Admission medications   Medication Sig Start Date End Date Taking? Authorizing Provider  indomethacin (INDOCIN) 25 MG capsule Take 1 capsule (25 mg total) by mouth 3 (three) times daily as needed. 01/24/19   Janne Napoleon, NP  lisinopril-hydrochlorothiazide (PRINZIDE,ZESTORETIC) 20-25 MG tablet Take 1 tablet by mouth daily. MUST SCHEDULE ANNUAL PHYSICAL EXAM 07/11/18   Lorre Munroe, NP  potassium chloride (K-DUR) 10 MEQ tablet Take 1 tablet (10 mEq total) by mouth 2 (two) times daily. 07/11/18   Lorre Munroe, NP  predniSONE (DELTASONE) 10 MG tablet Take 4 tablets (40 mg total) by mouth daily with breakfast. 01/24/19   Janne Napoleon, NP  simvastatin (ZOCOR) 10 MG tablet Take 1 tablet (10 mg total) by mouth at bedtime. 07/11/18   Lorre Munroe, NP  Vitamin D, Ergocalciferol, (DRISDOL) 50000 units CAPS  capsule Take 1 capsule (50,000 Units total) by mouth every 7 (seven) days. 07/11/18   Lorre Munroe, NP    Family History Family History  Problem Relation Age of Onset  . Hypertension Mother   . Prostate cancer Maternal Grandfather   . Cancer Neg Hx   . Diabetes Neg Hx   . Early death Neg Hx   . Stroke Neg Hx   . Colon cancer Neg Hx   . Breast cancer Neg Hx     Social History Social History   Tobacco Use  . Smoking status: Never Smoker  . Smokeless tobacco: Never Used  Substance Use Topics  . Alcohol use: No  . Drug use: No     Allergies   Patient has no known allergies.   Review of Systems Review of Systems  Musculoskeletal: Positive for arthralgias.  All other systems reviewed and are negative.    Physical Exam Updated Vital Signs BP 126/68 (BP Location: Left Arm)   Pulse 95   Temp 99.3 F (37.4 C) (Oral)   Resp 16   LMP 08/13/2014  SpO2 100%   Physical Exam Vitals signs and nursing note reviewed.  Constitutional:      General: She is not in acute distress.    Appearance: She is well-developed.  HENT:     Head: Normocephalic.     Mouth/Throat:     Mouth: Mucous membranes are moist.  Eyes:     Conjunctiva/sclera: Conjunctivae normal.  Neck:     Musculoskeletal: Neck supple.  Cardiovascular:     Rate and Rhythm: Normal rate.  Pulmonary:     Effort: Pulmonary effort is normal.  Musculoskeletal:     Left foot: Normal range of motion and normal capillary refill. Tenderness and swelling present. No deformity.       Feet:     Comments: Left foot at great toe and dorsum of foot below the great toe tender on exam, slightly increased warmth. Pedal pulse 2+.   Skin:    General: Skin is warm and dry.  Neurological:     Mental Status: She is alert and oriented to person, place, and time.  Psychiatric:        Mood and Affect: Mood normal.      ED Treatments / Results  Labs (all labs ordered are listed, but only abnormal results are  displayed) Labs Reviewed - No data to display  Radiology Dg Foot Complete Left  Result Date: 01/24/2019 CLINICAL DATA:  Foot swelling EXAM: LEFT FOOT - COMPLETE 3+ VIEW COMPARISON:  None. FINDINGS: No fracture or dislocation of mid foot or forefoot. The phalanges are normal. The calcaneus is normal. No soft tissue abnormality. IMPRESSION: No acute osseous abnormality.  No soft tissue abnormality evident Electronically Signed   By: Genevive Bi M.D.   On: 01/24/2019 13:45    Procedures Procedures (including critical care time)  Medications Ordered in ED Medications  HYDROcodone-acetaminophen (NORCO/VICODIN) 5-325 MG per tablet 1 tablet (1 tablet Oral Given 01/24/19 1309)     Initial Impression / Assessment and Plan / ED Course  I have reviewed the triage vital signs and the nursing notes.  58 y.o. female here with one week hx of left foot pain and swelling that is mostly in the area of the great toe. Patient stable for d/c without fever or signs of infection. Discussed with the patient plan of care and need for f/u with PCP. Patient agrees with plan. Return precautions discussed.  Final Clinical Impressions(s) / ED Diagnoses   Final diagnoses:  Gouty arthritis of left great toe    ED Discharge Orders         Ordered    predniSONE (DELTASONE) 10 MG tablet  Daily with breakfast     01/24/19 1410    indomethacin (INDOCIN) 25 MG capsule  3 times daily PRN     01/24/19 1410           Damian Leavell [place], NP 01/24/19 1551    Raeford Razor, MD 01/25/19 [phone]

## 2019-01-24 NOTE — Discharge Instructions (Addendum)
Schedule an appointment with your doctor to discuss your symptoms and further treatment.

## 2019-01-24 NOTE — ED Triage Notes (Signed)
Pt states her L foot started swelling 1 week ago. Denies injury. Alert and oriented.

## 2019-05-15 ENCOUNTER — Ambulatory Visit: Payer: BC Managed Care – PPO | Admitting: Internal Medicine

## 2019-06-17 ENCOUNTER — Other Ambulatory Visit: Payer: Self-pay

## 2019-06-17 ENCOUNTER — Ambulatory Visit
Admission: RE | Admit: 2019-06-17 | Discharge: 2019-06-17 | Disposition: A | Discharge status: Home / Self Care | Payer: No Typology Code available for payment source | Source: Ambulatory Visit | Attending: Internal Medicine | Admitting: Internal Medicine

## 2019-06-17 DIAGNOSIS — Z1231 Encounter for screening mammogram for malignant neoplasm of breast: Secondary | ICD-10-CM

## 2019-08-11 ENCOUNTER — Ambulatory Visit: Payer: No Typology Code available for payment source | Admitting: Internal Medicine

## 2019-08-18 ENCOUNTER — Ambulatory Visit (INDEPENDENT_AMBULATORY_CARE_PROVIDER_SITE_OTHER): Payer: Self-pay | Admitting: Internal Medicine

## 2019-08-18 ENCOUNTER — Encounter: Payer: Self-pay | Admitting: Internal Medicine

## 2019-08-18 ENCOUNTER — Other Ambulatory Visit: Payer: Self-pay

## 2019-08-18 VITALS — BP 124/82 | HR 83 | Temp 97.9°F | Ht 59.5 in | Wt 160.0 lb

## 2019-08-18 DIAGNOSIS — E559 Vitamin D deficiency, unspecified: Secondary | ICD-10-CM

## 2019-08-18 DIAGNOSIS — Z780 Asymptomatic menopausal state: Secondary | ICD-10-CM

## 2019-08-18 DIAGNOSIS — L723 Sebaceous cyst: Secondary | ICD-10-CM

## 2019-08-18 DIAGNOSIS — N289 Disorder of kidney and ureter, unspecified: Secondary | ICD-10-CM

## 2019-08-18 DIAGNOSIS — Z0001 Encounter for general adult medical examination with abnormal findings: Secondary | ICD-10-CM

## 2019-08-18 DIAGNOSIS — I10 Essential (primary) hypertension: Secondary | ICD-10-CM

## 2019-08-18 DIAGNOSIS — E7800 Pure hypercholesterolemia, unspecified: Secondary | ICD-10-CM

## 2019-08-18 LAB — CBC
HCT: 32.9 % — ABNORMAL LOW (ref 36.0–46.0)
Hemoglobin: 10.4 g/dL — ABNORMAL LOW (ref 12.0–15.0)
MCHC: 31.8 g/dL (ref 30.0–36.0)
MCV: 87.2 fl (ref 78.0–100.0)
Platelets: 239 10*3/uL (ref 150.0–400.0)
RBC: 3.77 Mil/uL — ABNORMAL LOW (ref 3.87–5.11)
RDW: 13.1 % (ref 11.5–15.5)
WBC: 6.9 10*3/uL (ref 4.0–10.5)

## 2019-08-18 LAB — COMPREHENSIVE METABOLIC PANEL
ALT: 13 U/L (ref 0–35)
AST: 14 U/L (ref 0–37)
Albumin: 4.6 g/dL (ref 3.5–5.2)
Alkaline Phosphatase: 52 U/L (ref 39–117)
BUN: 23 mg/dL (ref 6–23)
CO2: 28 mEq/L (ref 19–32)
Calcium: 10.2 mg/dL (ref 8.4–10.5)
Chloride: 104 mEq/L (ref 96–112)
Creatinine, Ser: 1.2 mg/dL (ref 0.40–1.20)
GFR: 55.84 mL/min — ABNORMAL LOW (ref >60.00)
Glucose, Bld: 92 mg/dL (ref 70–99)
Potassium: 3.7 mEq/L (ref 3.5–5.1)
Sodium: 141 mEq/L (ref 135–145)
Total Bilirubin: 0.5 mg/dL (ref 0.2–1.2)
Total Protein: 7.4 g/dL (ref 6.0–8.3)

## 2019-08-18 LAB — LIPID PANEL
Cholesterol: 202 mg/dL — ABNORMAL HIGH (ref 0–200)
HDL: 59.3 mg/dL (ref >39.00)
NonHDL: 142.3
Total CHOL/HDL Ratio: 3
Triglycerides: 272 mg/dL — ABNORMAL HIGH (ref 0.0–149.0)
VLDL: 54.4 mg/dL — ABNORMAL HIGH (ref 0.0–40.0)

## 2019-08-18 LAB — VITAMIN D 25 HYDROXY (VIT D DEFICIENCY, FRACTURES): VITD: 19.58 ng/mL — ABNORMAL LOW (ref 30.00–100.00)

## 2019-08-18 LAB — LDL CHOLESTEROL, DIRECT: Direct LDL: 106 mg/dL

## 2019-08-18 NOTE — Assessment & Plan Note (Signed)
Stable on Lisinopril HCT CMET today Reinforced DASH diet and exercise for weight loss

## 2019-08-18 NOTE — Patient Instructions (Signed)

## 2019-08-18 NOTE — Assessment & Plan Note (Signed)
CMET and Lipid profile today Encouraged her to consume a low fat diet Continue Simvastatin for now 

## 2019-08-18 NOTE — Progress Notes (Signed)
Subjective:    Patient ID: Anita Ramirez, female    DOB: 12/15/1960, 58 y.o.   MRN: [id number]  HPI  Pt presents to the clinic today for her annual exam. She is also due to follow up chronic conditions.  HTN: Her BP today is 124/82. She is taking Lisinopril HCT as prescribed. There is no ECG on file.  HLD: Her last LDL was 130, 10/2018. She denies myalgias on Simvastatin. She does not consume a low fat diet.  Flu: 08/2015 Tetanus: 04/2013 Pap Smear: 12/2016 Mammogram: 12/2017 Bone Density: never Colon Screening: 06/2013 Vision Screening: as needed Dentist: as needed  Diet: Eats meat, fruits and vegetables, fried foods. Drinks water and juice.  Exercise: None  Review of Systems  Past Medical History:  Diagnosis Date  . Anemia   . History of chicken pox   . Hyperlipemia   . Hypertension   . Obesity     Current Outpatient Medications  Medication Sig Dispense Refill  . indomethacin (INDOCIN) 25 MG capsule Take 1 capsule (25 mg total) by mouth 3 (three) times daily as needed. 20 capsule 0  . lisinopril-hydrochlorothiazide (PRINZIDE,ZESTORETIC) 20-25 MG tablet Take 1 tablet by mouth daily. MUST SCHEDULE ANNUAL PHYSICAL EXAM 90 tablet 3  . potassium chloride (K-DUR) 10 MEQ tablet Take 1 tablet (10 mEq total) by mouth 2 (two) times daily. 180 tablet 3  . predniSONE (DELTASONE) 10 MG tablet Take 4 tablets (40 mg total) by mouth daily with breakfast. 20 tablet 0  . simvastatin (ZOCOR) 10 MG tablet Take 1 tablet (10 mg total) by mouth at bedtime. 90 tablet 3  . Vitamin D, Ergocalciferol, (DRISDOL) 50000 units CAPS capsule Take 1 capsule (50,000 Units total) by mouth every 7 (seven) days. 12 capsule 0   No current facility-administered medications for this visit.     No Known Allergies  Family History  Problem Relation Age of Onset  . Hypertension Mother   . Prostate cancer Maternal Grandfather   . Cancer Neg Hx   . Diabetes Neg Hx   . Early death Neg Hx   . Stroke Neg  Hx   . Colon cancer Neg Hx   . Breast cancer Neg Hx     Social History   Socioeconomic History  . Marital status: Single    Spouse name: Not on file  . Number of children: Not on file  . Years of education: 76  . Highest education level: Not on file  Occupational History  . Not on file  Social Needs  . Financial resource strain: Not on file  . Food insecurity    Worry: Not on file    Inability: Not on file  . Transportation needs    Medical: Not on file    Non-medical: Not on file  Tobacco Use  . Smoking status: Never Smoker  . Smokeless tobacco: Never Used  Substance and Sexual Activity  . Alcohol use: No  . Drug use: No  . Sexual activity: Not Currently  Lifestyle  . Physical activity    Days per week: Not on file    Minutes per session: Not on file  . Stress: Not on file  Relationships  . Social Musician on phone: Not on file    Gets together: Not on file    Attends religious service: Not on file    Active member of club or organization: Not on file    Attends meetings of clubs or organizations: Not on  file    Relationship status: Not on file  . Intimate partner violence    Fear of current or ex partner: Not on file    Emotionally abused: Not on file    Physically abused: Not on file    Forced sexual activity: Not on file  Other Topics Concern  . Not on file  Social History Narrative   Regular exercise-yes   Caffeine Use-no     Constitutional: Denies fever, malaise, fatigue, headache or abrupt weight changes.  HEENT: Denies eye pain, eye redness, ear pain, ringing in the ears, wax buildup, runny nose, nasal congestion, bloody nose, or sore throat. Respiratory: Denies difficulty breathing, shortness of breath, cough or sputum production.   Cardiovascular: Denies chest pain, chest tightness, palpitations or swelling in the hands or feet.  Gastrointestinal: Denies abdominal pain, bloating, constipation, diarrhea or blood in the stool.  GU:  Denies urgency, frequency, pain with urination, burning sensation, blood in urine, odor or discharge. Musculoskeletal: Denies decrease in range of motion, difficulty with gait, muscle pain or joint pain and swelling.  Skin: Denies redness, rashes, lesions or ulcercations.  Neurological: Denies dizziness, difficulty with memory, difficulty with speech or problems with balance and coordination.  Psych: Denies anxiety, depression, SI/HI.  No other specific complaints in a complete review of systems (except as listed in HPI above).     Objective:   Physical Exam   BP 124/82   Pulse 83   Temp 97.9 F (36.6 C) (Temporal)   Ht 4' 11.5" (1.511 m)   Wt 160 lb (72.6 kg)   LMP 08/13/2014   SpO2 98%   BMI 31.78 kg/m  Wt Readings from Last 3 Encounters:  08/18/19 160 lb (72.6 kg)  07/11/18 161 lb (73 kg)  02/28/18 162 lb (73.5 kg)    General: Appears her stated age, well developed, well nourished in NAD. Skin: Warm, dry and intact. No rashes noted. Sebaceous cyst noted of left upper chest. HEENT: Head: normal shape and size; Eyes: sclera white, no icterus, conjunctiva pink, PERRLA and EOMs intact; Ears: Tm's gray and intact, normal light reflex;  Neck:  Neck supple, trachea midline. No masses, lumps or thyromegaly present.  Cardiovascular: Normal rate and rhythm. S1,S2 noted.  No murmur, rubs or gallops noted. No JVD or BLE edema. No carotid bruits noted. Pulmonary/Chest: Normal effort and positive vesicular breath sounds. No respiratory distress. No wheezes, rales or ronchi noted.  Abdomen: Soft and nontender. Normal bowel sounds. No distention or masses noted. Liver, spleen and kidneys non palpable. Musculoskeletal: Strength 5/5 BUEBLE. No difficulty with gait.  Neurological: Alert and oriented. Cranial nerves II-XII grossly intact. Coordination normal.  Psychiatric: Mood and affect normal. Behavior is normal. Judgment and thought content normal.    BMET    Component Value Date/Time    NA 142 10/31/2018 0920   K 3.7 10/31/2018 0920   CL 106 10/31/2018 0920   CO2 28 10/31/2018 0920   GLUCOSE 105 (H) 10/31/2018 0920   BUN 26 (H) 10/31/2018 0920   CREATININE 1.13 10/31/2018 0920   CALCIUM 9.9 10/31/2018 0920    Lipid Panel     Component Value Date/Time   CHOL 211 (H) 10/31/2018 0920   TRIG 125.0 10/31/2018 0920   HDL 55.60 10/31/2018 0920   CHOLHDL 4 10/31/2018 0920   VLDL 25.0 10/31/2018 0920   LDLCALC 130 (H) 10/31/2018 0920    CBC    Component Value Date/Time   WBC 6.0 07/11/2018 1245   RBC 3.56 (  L) 07/11/2018 1245   HGB 10.1 (L) 07/11/2018 1245   HGB 11.0 (L) 05/27/2012 1113   HCT 31.1 (L) 07/11/2018 1245   HCT 33.7 (L) 05/27/2012 1113   PLT 245.0 07/11/2018 1245   PLT 205 05/27/2012 1113   MCV 87.5 07/11/2018 1245   MCV 90.0 05/27/2012 1113   MCH 29.5 05/27/2012 1113   MCHC 32.4 07/11/2018 1245   RDW 12.9 07/11/2018 1245   RDW 12.2 05/27/2012 1113   LYMPHSABS 3.0 05/27/2012 1113   MONOABS 0.5 05/27/2012 1113   EOSABS 0.0 05/27/2012 1113   BASOSABS 0.0 05/27/2012 1113    Hgb A1C Lab Results  Component Value Date   HGBA1C 4.7 05/08/2013           Assessment & Plan:   Preventative Health Maintenance:  Flu shot today Tetanus UTD Pap smear UTD She will call to schedule her mammogram Bone density ordered, she will schedule this with her mammogram Colon screening UTD Encouraged her to consume a balanced diet and exercise regimen Advised her to see an eye doctor and dentist annually Will check CBC, CMET, Lipid and Vit D   Sebaceous Cyst of Chest:  No intervention Monitor RTC in 1 year, sooner if needed Webb Silversmith, NP

## 2019-08-20 ENCOUNTER — Telehealth: Payer: Self-pay | Admitting: Internal Medicine

## 2019-08-20 NOTE — Telephone Encounter (Signed)
Best number [phone] Pt returned your call

## 2019-08-20 NOTE — Telephone Encounter (Signed)
Disregard message I called about bone density

## 2019-08-27 NOTE — Addendum Note (Signed)
Addended by: Lurlean Nanny on: 08/27/2019 10:45 AM   Modules accepted: Orders

## 2019-08-28 ENCOUNTER — Other Ambulatory Visit: Payer: Self-pay | Admitting: Internal Medicine

## 2019-08-28 DIAGNOSIS — E785 Hyperlipidemia, unspecified: Secondary | ICD-10-CM

## 2019-08-28 DIAGNOSIS — E876 Hypokalemia: Secondary | ICD-10-CM

## 2019-09-26 ENCOUNTER — Other Ambulatory Visit: Payer: Self-pay

## 2019-11-03 ENCOUNTER — Inpatient Hospital Stay: Admission: RE | Admit: 2019-11-03 | Payer: No Typology Code available for payment source | Source: Ambulatory Visit

## 2019-11-10 ENCOUNTER — Telehealth: Payer: Self-pay | Admitting: Internal Medicine

## 2019-11-10 NOTE — Telephone Encounter (Signed)
Patient stated she had a covid test done and wanted to know if we have received the results  Please advise

## 2019-11-11 NOTE — Telephone Encounter (Signed)
Spoke to pt and she stated her test was completed by the health dept... I let pt know that they are not in our system and we are unable to see results

## 2019-11-26 ENCOUNTER — Telehealth: Payer: Self-pay

## 2019-11-26 NOTE — Telephone Encounter (Signed)
LVM w COVID screen and back lab info 1.6.2021 TLJ

## 2019-11-28 ENCOUNTER — Other Ambulatory Visit: Payer: Self-pay

## 2019-12-02 ENCOUNTER — Other Ambulatory Visit: Payer: Self-pay

## 2019-12-02 ENCOUNTER — Other Ambulatory Visit (INDEPENDENT_AMBULATORY_CARE_PROVIDER_SITE_OTHER): Payer: Self-pay

## 2019-12-02 DIAGNOSIS — I10 Essential (primary) hypertension: Secondary | ICD-10-CM

## 2019-12-02 DIAGNOSIS — E7800 Pure hypercholesterolemia, unspecified: Secondary | ICD-10-CM

## 2019-12-02 DIAGNOSIS — E559 Vitamin D deficiency, unspecified: Secondary | ICD-10-CM

## 2019-12-02 LAB — CBC
HCT: 31.2 % — ABNORMAL LOW (ref 36.0–46.0)
Hemoglobin: 9.8 g/dL — ABNORMAL LOW (ref 12.0–15.0)
MCHC: 31.5 g/dL (ref 30.0–36.0)
MCV: 89.7 fl (ref 78.0–100.0)
Platelets: 279 10*3/uL (ref 150.0–400.0)
RBC: 3.48 Mil/uL — ABNORMAL LOW (ref 3.87–5.11)
RDW: 15.6 % — ABNORMAL HIGH (ref 11.5–15.5)
WBC: 6.5 10*3/uL (ref 4.0–10.5)

## 2019-12-02 LAB — LIPID PANEL
Cholesterol: 201 mg/dL — ABNORMAL HIGH (ref 0–200)
HDL: 66.1 mg/dL (ref >39.00)
LDL Cholesterol: 100 mg/dL — ABNORMAL HIGH (ref 0–99)
NonHDL: 135.16
Total CHOL/HDL Ratio: 3
Triglycerides: 175 mg/dL — ABNORMAL HIGH (ref 0.0–149.0)
VLDL: 35 mg/dL (ref 0.0–40.0)

## 2019-12-02 LAB — VITAMIN D 25 HYDROXY (VIT D DEFICIENCY, FRACTURES): VITD: 15.84 ng/mL — ABNORMAL LOW (ref 30.00–100.00)

## 2019-12-09 MED ORDER — VITAMIN D (ERGOCALCIFEROL) 1.25 MG (50000 UNIT) PO CAPS: 50000.0000 [IU] | ORAL_CAPSULE | ORAL | 0 refills | Status: DC

## 2019-12-09 NOTE — Addendum Note (Signed)
Addended by: Roena Malady on: 12/09/2019 08:32 AM   Modules accepted: Orders

## 2020-07-30 ENCOUNTER — Other Ambulatory Visit: Payer: Self-pay | Admitting: Internal Medicine

## 2020-07-30 DIAGNOSIS — Z1231 Encounter for screening mammogram for malignant neoplasm of breast: Secondary | ICD-10-CM

## 2020-08-13 ENCOUNTER — Ambulatory Visit
Admission: RE | Admit: 2020-08-13 | Discharge: 2020-08-13 | Disposition: A | Discharge status: Home / Self Care | Payer: 59 | Source: Ambulatory Visit | Attending: Internal Medicine | Admitting: Internal Medicine

## 2020-08-13 ENCOUNTER — Other Ambulatory Visit: Payer: Self-pay

## 2020-08-13 DIAGNOSIS — Z1231 Encounter for screening mammogram for malignant neoplasm of breast: Secondary | ICD-10-CM

## 2021-06-02 ENCOUNTER — Other Ambulatory Visit: Payer: Self-pay | Admitting: Internal Medicine

## 2021-06-02 DIAGNOSIS — Z1231 Encounter for screening mammogram for malignant neoplasm of breast: Secondary | ICD-10-CM

## 2021-07-28 ENCOUNTER — Encounter: Payer: 59 | Admitting: Nurse Practitioner

## 2021-08-11 ENCOUNTER — Encounter: Payer: 59 | Admitting: Nurse Practitioner

## 2021-08-18 ENCOUNTER — Ambulatory Visit
Admission: RE | Admit: 2021-08-18 | Discharge: 2021-08-18 | Disposition: A | Discharge status: Home / Self Care | Payer: 59 | Source: Ambulatory Visit | Attending: Internal Medicine | Admitting: Internal Medicine

## 2021-08-18 ENCOUNTER — Other Ambulatory Visit: Payer: Self-pay

## 2021-08-18 DIAGNOSIS — Z1231 Encounter for screening mammogram for malignant neoplasm of breast: Secondary | ICD-10-CM

## 2021-08-25 ENCOUNTER — Ambulatory Visit (INDEPENDENT_AMBULATORY_CARE_PROVIDER_SITE_OTHER): Payer: 59 | Admitting: Nurse Practitioner

## 2021-08-25 ENCOUNTER — Encounter: Payer: Self-pay | Admitting: Nurse Practitioner

## 2021-08-25 ENCOUNTER — Other Ambulatory Visit: Payer: Self-pay

## 2021-08-25 VITALS — BP 198/90 | HR 100 | Temp 97.6°F | Resp 12 | Ht 59.75 in | Wt 153.4 lb

## 2021-08-25 DIAGNOSIS — E785 Hyperlipidemia, unspecified: Secondary | ICD-10-CM

## 2021-08-25 DIAGNOSIS — E876 Hypokalemia: Secondary | ICD-10-CM | POA: Diagnosis not present

## 2021-08-25 DIAGNOSIS — Z23 Encounter for immunization: Secondary | ICD-10-CM

## 2021-08-25 DIAGNOSIS — I10 Essential (primary) hypertension: Secondary | ICD-10-CM

## 2021-08-25 DIAGNOSIS — T502X5A Adverse effect of carbonic-anhydrase inhibitors, benzothiadiazides and other diuretics, initial encounter: Secondary | ICD-10-CM | POA: Insufficient documentation

## 2021-08-25 DIAGNOSIS — E559 Vitamin D deficiency, unspecified: Secondary | ICD-10-CM | POA: Diagnosis not present

## 2021-08-25 LAB — VITAMIN D 25 HYDROXY (VIT D DEFICIENCY, FRACTURES): VITD: 15.38 ng/mL — ABNORMAL LOW (ref 30.00–100.00)

## 2021-08-25 LAB — COMPREHENSIVE METABOLIC PANEL
ALT: 17 U/L (ref 0–35)
AST: 17 U/L (ref 0–37)
Albumin: 4.3 g/dL (ref 3.5–5.2)
Alkaline Phosphatase: 55 U/L (ref 39–117)
BUN: 14 mg/dL (ref 6–23)
CO2: 32 mEq/L (ref 19–32)
Calcium: 9.9 mg/dL (ref 8.4–10.5)
Chloride: 104 mEq/L (ref 96–112)
Creatinine, Ser: 1.07 mg/dL (ref 0.40–1.20)
GFR: 56.66 mL/min — ABNORMAL LOW (ref >60.00)
Glucose, Bld: 99 mg/dL (ref 70–99)
Potassium: 3.4 mEq/L — ABNORMAL LOW (ref 3.5–5.1)
Sodium: 143 mEq/L (ref 135–145)
Total Bilirubin: 0.5 mg/dL (ref 0.2–1.2)
Total Protein: 6.9 g/dL (ref 6.0–8.3)

## 2021-08-25 LAB — CBC
HCT: 33.4 % — ABNORMAL LOW (ref 36.0–46.0)
Hemoglobin: 10.8 g/dL — ABNORMAL LOW (ref 12.0–15.0)
MCHC: 32.2 g/dL (ref 30.0–36.0)
MCV: 87.6 fl (ref 78.0–100.0)
Platelets: 230 10*3/uL (ref 150.0–400.0)
RBC: 3.82 Mil/uL — ABNORMAL LOW (ref 3.87–5.11)
RDW: 13.1 % (ref 11.5–15.5)
WBC: 5.4 10*3/uL (ref 4.0–10.5)

## 2021-08-25 LAB — LIPID PANEL
Cholesterol: 261 mg/dL — ABNORMAL HIGH (ref 0–200)
HDL: 62.7 mg/dL (ref >39.00)
NonHDL: 198.79
Total CHOL/HDL Ratio: 4
Triglycerides: 210 mg/dL — ABNORMAL HIGH (ref 0.0–149.0)
VLDL: 42 mg/dL — ABNORMAL HIGH (ref 0.0–40.0)

## 2021-08-25 LAB — LDL CHOLESTEROL, DIRECT: Direct LDL: 158 mg/dL

## 2021-08-25 MED ORDER — SIMVASTATIN 10 MG PO TABS: ORAL_TABLET | ORAL | 2 refills | Status: DC

## 2021-08-25 MED ORDER — LISINOPRIL-HYDROCHLOROTHIAZIDE 20-25 MG PO TABS: 1.0000 | ORAL_TABLET | Freq: Every day | ORAL | 2 refills | Status: DC

## 2021-08-25 NOTE — Assessment & Plan Note (Signed)
Historical diagnosis pending labs before we decide whether to restart potassium or not.

## 2021-08-25 NOTE — Patient Instructions (Signed)
Nice to see you today Will have you swing by in 2 weeks to recheck that blood pressure. I will be in touch with the lab results

## 2021-08-25 NOTE — Progress Notes (Signed)
Established Patient Office Visit  Subjective:  Patient ID: Anita Ramirez, female    DOB: 11/01/1961  Age: 60 y.o. MRN: [id number]  CC:  Chief Complaint  Patient presents with   Transfer of Care    HPI Anita Ramirez presents for   HTN: Patient does not check BP at home. Has cuff but needs batteries HLD: Eating once a day with variety of food. No exercise.  Vitamin D Def: not currently on medication  Past Medical History:  Diagnosis Date   Anemia    History of chicken pox    Hyperlipemia    Hypertension    Obesity     Past Surgical History:  Procedure Laterality Date   no prior surgery      Family History  Problem Relation Age of Onset   Hypertension Mother    Prostate cancer Maternal Grandfather    Cancer Neg Hx    Diabetes Neg Hx    Early death Neg Hx    Stroke Neg Hx    Colon cancer Neg Hx    Breast cancer Neg Hx     Social History   Socioeconomic History   Marital status: Single    Spouse name: Not on file   Number of children: Not on file   Years of education: 12   Highest education level: Not on file  Occupational History   Not on file  Tobacco Use   Smoking status: Never   Smokeless tobacco: Never  Substance and Sexual Activity   Alcohol use: No   Drug use: No   Sexual activity: Not Currently  Other Topics Concern   Not on file  Social History Narrative   Regular exercise-yes   Caffeine Use-no   Social Determinants of Health   Financial Resource Strain: Not on file  Food Insecurity: Not on file  Transportation Needs: Not on file  Physical Activity: Not on file  Stress: Not on file  Social Connections: Not on file  Intimate Partner Violence: Not on file    Outpatient Medications Prior to Visit  Medication Sig Dispense Refill   lisinopril-hydrochlorothiazide (ZESTORETIC) 20-25 MG tablet Take 1 tablet by mouth daily. (Patient not taking: Reported on 08/25/2021) 90 tablet 2   potassium chloride (KLOR-CON) 10 MEQ tablet TAKE  1 TABLET(10 MEQ) BY MOUTH TWICE DAILY (Patient not taking: Reported on 08/25/2021) 180 tablet 2   simvastatin (ZOCOR) 10 MG tablet TAKE 1 TABLET(10 MG) BY MOUTH AT BEDTIME (Patient not taking: Reported on 08/25/2021) 90 tablet 2   Vitamin D, Ergocalciferol, (DRISDOL) 1.25 MG (50000 UNIT) CAPS capsule Take 1 capsule (50,000 Units total) by mouth every 7 (seven) days. (Patient not taking: Reported on 08/25/2021) 12 capsule 0   No facility-administered medications prior to visit.    No Known Allergies  ROS Review of Systems  Constitutional:  Negative for chills, fatigue and fever.  Respiratory:  Negative for cough and shortness of breath.   Cardiovascular:  Negative for chest pain, palpitations and leg swelling.  Gastrointestinal:  Negative for blood in stool, constipation, diarrhea, nausea and vomiting.  Genitourinary:  Negative for dysuria, hematuria, vaginal bleeding, vaginal discharge and vaginal pain.  Neurological:  Negative for dizziness, light-headedness, numbness and headaches.  Psychiatric/Behavioral:  Negative for hallucinations and suicidal ideas.      Objective:    Physical Exam Vitals and nursing note reviewed.  HENT:     Right Ear: Tympanic membrane, ear canal and external ear normal. There is no impacted cerumen.  Left Ear: Tympanic membrane, ear canal and external ear normal. There is no impacted cerumen.     Mouth/Throat:     Mouth: Mucous membranes are moist.     Pharynx: Oropharynx is clear.  Eyes:     Extraocular Movements: Extraocular movements intact.     Pupils: Pupils are equal, round, and reactive to light.  Neck:     Thyroid: No thyroid mass, thyromegaly or thyroid tenderness.  Cardiovascular:     Rate and Rhythm: Normal rate.     Heart sounds: Murmur heard.  Pulmonary:     Effort: Pulmonary effort is normal.     Breath sounds: Normal breath sounds.  Abdominal:     General: Bowel sounds are normal.  Musculoskeletal:     Right lower leg: No edema.      Left lower leg: No edema.  Lymphadenopathy:     Cervical: No cervical adenopathy.  Skin:    General: Skin is warm.  Neurological:     General: No focal deficit present.     Mental Status: She is alert.     Motor: No weakness.     Coordination: Coordination normal.     Gait: Gait normal.     Deep Tendon Reflexes: Reflexes normal.  Psychiatric:        Mood and Affect: Mood normal.        Behavior: Behavior normal.        Thought Content: Thought content normal.        Judgment: Judgment normal.    BP (!) 198/90   Pulse 100   Temp 97.6 F (36.4 C)   Resp 12   Ht 4' 11.75" (1.518 m)   Wt 153 lb 7 oz (69.6 kg)   LMP 08/13/2014   SpO2 95%   BMI 30.22 kg/m  Wt Readings from Last 3 Encounters:  08/25/21 153 lb 7 oz (69.6 kg)  08/18/19 160 lb (72.6 kg)  07/11/18 161 lb (73 kg)     Health Maintenance Due  Topic Date Due   COVID-19 Vaccine (1) Never done   Hepatitis C Screening  Never done   Zoster Vaccines- Shingrix (1 of 2) Never done   PAP SMEAR-Modifier  01/10/2020   INFLUENZA VACCINE  06/20/2021    There are no preventive care reminders to display for this patient.  Lab Results  Component Value Date   TSH 3.96 05/08/2013   Lab Results  Component Value Date   WBC 6.5 12/02/2019   HGB 9.8 (L) 12/02/2019   HCT 31.2 (L) 12/02/2019   MCV 89.7 12/02/2019   PLT 279.0 12/02/2019   Lab Results  Component Value Date   NA 141 08/18/2019   K 3.7 08/18/2019   CO2 28 08/18/2019   GLUCOSE 92 08/18/2019   BUN 23 08/18/2019   CREATININE 1.20 08/18/2019   BILITOT 0.5 08/18/2019   ALKPHOS 52 08/18/2019   AST 14 08/18/2019   ALT 13 08/18/2019   PROT 7.4 08/18/2019   ALBUMIN 4.6 08/18/2019   CALCIUM 10.2 08/18/2019   GFR 55.84 (L) 08/18/2019   Lab Results  Component Value Date   CHOL 201 (H) 12/02/2019   Lab Results  Component Value Date   HDL 66.10 12/02/2019   Lab Results  Component Value Date   LDLCALC 100 (H) 12/02/2019   Lab Results  Component  Value Date   TRIG 175.0 (H) 12/02/2019   Lab Results  Component Value Date   CHOLHDL 3 12/02/2019   Lab Results  Component Value Date   HGBA1C 4.7 05/08/2013      Assessment & Plan:   Problem List Items Addressed This Visit       Cardiovascular and Mediastinum   Uncontrolled hypertension - Primary    Patient presents for transfer of care.  She has been out of her medications for an extended period of time.  Has not had any blood pressure medicine today.  We will restart patient's blood pressure medication of lisinopril-HCTZ daily.  We will check baseline labs pending lab results.  Patient to follow-up closely to see if there is improvement in blood pressure.  Patient asymptomatic in office      Relevant Medications   lisinopril-hydrochlorothiazide (ZESTORETIC) 20-25 MG tablet   simvastatin (ZOCOR) 10 MG tablet   Other Relevant Orders   EKG 12-Lead (Completed)   CBC   Comprehensive metabolic panel     Other   Hyperlipemia    History of the same.  Currently maintained on simvastatin 10 mg.  Patient states that she tolerates medication well without any adverse drug events.  She has been on this medication does the same as all her others.  We will check lipid panel today along with other labs.  Pending lab results follow-up in a few weeks for recheck      Relevant Medications   lisinopril-hydrochlorothiazide (ZESTORETIC) 20-25 MG tablet   simvastatin (ZOCOR) 10 MG tablet   Other Relevant Orders   Lipid panel   Diuretic-induced hypokalemia    Historical diagnosis pending labs before we decide whether to restart potassium or not.      Relevant Medications   lisinopril-hydrochlorothiazide (ZESTORETIC) 20-25 MG tablet   Vitamin D deficiency    History of the same we will recheck level today.  Likely need to go on prescription replacement as before.      Relevant Orders   VITAMIN D 25 Hydroxy (Vit-D Deficiency, Fractures)   Other Visit Diagnoses     Need for influenza  vaccination       Relevant Orders   Flu Vaccine QUAD 6+ mos PF IM (Fluarix Quad PF) (Completed)       No orders of the defined types were placed in this encounter.   Follow-up: No follow-ups on file.   This visit occurred during the SARS-CoV-2 public health emergency.  Safety protocols were in place, including screening questions prior to the visit, additional usage of staff PPE, and extensive cleaning of exam room while observing appropriate contact time as indicated for disinfecting solutions.   Audria Nine, NP

## 2021-08-25 NOTE — Assessment & Plan Note (Signed)
History of the same.  Currently maintained on simvastatin 10 mg.  Patient states that she tolerates medication well without any adverse drug events.  She has been on this medication does the same as all her others.  We will check lipid panel today along with other labs.  Pending lab results follow-up in a few weeks for recheck

## 2021-08-25 NOTE — Assessment & Plan Note (Signed)
Patient presents for transfer of care.  She has been out of her medications for an extended period of time.  Has not had any blood pressure medicine today.  We will restart patient's blood pressure medication of lisinopril-HCTZ daily.  We will check baseline labs pending lab results.  Patient to follow-up closely to see if there is improvement in blood pressure.  Patient asymptomatic in office

## 2021-08-25 NOTE — Assessment & Plan Note (Signed)
History of the same we will recheck level today.  Likely need to go on prescription replacement as before.

## 2021-08-26 ENCOUNTER — Other Ambulatory Visit: Payer: Self-pay | Admitting: Nurse Practitioner

## 2021-08-26 DIAGNOSIS — E876 Hypokalemia: Secondary | ICD-10-CM

## 2021-08-26 DIAGNOSIS — R7989 Other specified abnormal findings of blood chemistry: Secondary | ICD-10-CM

## 2021-08-26 DIAGNOSIS — E559 Vitamin D deficiency, unspecified: Secondary | ICD-10-CM

## 2021-08-26 DIAGNOSIS — I10 Essential (primary) hypertension: Secondary | ICD-10-CM

## 2021-08-26 DIAGNOSIS — E785 Hyperlipidemia, unspecified: Secondary | ICD-10-CM

## 2021-08-26 MED ORDER — VITAMIN D (ERGOCALCIFEROL) 1.25 MG (50000 UNIT) PO CAPS: 50000.0000 [IU] | ORAL_CAPSULE | ORAL | 0 refills | Status: DC

## 2021-09-08 ENCOUNTER — Ambulatory Visit (INDEPENDENT_AMBULATORY_CARE_PROVIDER_SITE_OTHER): Payer: 59 | Admitting: Nurse Practitioner

## 2021-09-08 ENCOUNTER — Encounter: Payer: Self-pay | Admitting: Nurse Practitioner

## 2021-09-08 ENCOUNTER — Other Ambulatory Visit: Payer: Self-pay

## 2021-09-08 VITALS — BP 164/92 | HR 94 | Temp 97.5°F | Resp 12 | Ht 59.75 in | Wt 151.0 lb

## 2021-09-08 DIAGNOSIS — I10 Essential (primary) hypertension: Secondary | ICD-10-CM | POA: Diagnosis not present

## 2021-09-08 LAB — COMPREHENSIVE METABOLIC PANEL
ALT: 23 U/L (ref 0–35)
AST: 22 U/L (ref 0–37)
Albumin: 4.7 g/dL (ref 3.5–5.2)
Alkaline Phosphatase: 49 U/L (ref 39–117)
BUN: 25 mg/dL — ABNORMAL HIGH (ref 6–23)
CO2: 29 mEq/L (ref 19–32)
Calcium: 10.4 mg/dL (ref 8.4–10.5)
Chloride: 103 mEq/L (ref 96–112)
Creatinine, Ser: 1.14 mg/dL (ref 0.40–1.20)
GFR: 52.5 mL/min — ABNORMAL LOW (ref >60.00)
Glucose, Bld: 86 mg/dL (ref 70–99)
Potassium: 3.5 mEq/L (ref 3.5–5.1)
Sodium: 142 mEq/L (ref 135–145)
Total Bilirubin: 0.5 mg/dL (ref 0.2–1.2)
Total Protein: 7.8 g/dL (ref 6.0–8.3)

## 2021-09-08 MED ORDER — AMLODIPINE BESYLATE 5 MG PO TABS: 5.0000 mg | ORAL_TABLET | Freq: Every day | ORAL | 0 refills | Status: DC

## 2021-09-08 NOTE — Assessment & Plan Note (Signed)
But notPatient been taking medication as prescribed per her report.  Denies any adverse drug events.  Patient looks good in office blood pressure still above goal she has been checking it daily at home states has been closer to goal systolically close to goal diastolically.  We will add on amlodipine 5 mg.  Did discuss possible side effects of medication.  She will continue monitoring her blood pressure at home at least 3 times weekly and we will call to get numbers.  We will check CMP today as we did start her on statin last visit along with lisinopril-HCTZ pending lab results.

## 2021-09-08 NOTE — Patient Instructions (Signed)
Continue taking the medication. I am going to add another one on. I will be in touch with your lab results. When you have a lab visit in january we will see you in office

## 2021-09-08 NOTE — Progress Notes (Signed)
Established Patient Office Visit  Subjective:  Patient ID: Anita Ramirez, female    DOB: [birth year]  Age: 60 y.o. MRN: [id number]  CC:  Chief Complaint  Patient presents with   Hypertension    Follow up    HPI Anita Ramirez presents for HTN  Patient has been checking her blood pressure. Checking blood pressure everyday 130s-140/ 90s-100s. Takes blood pressure pill in the morning time. Has taken blood pressure today. No symptoms.  Past Medical History:  Diagnosis Date   Anemia    History of chicken pox    Hyperlipemia    Hypertension    Obesity     Past Surgical History:  Procedure Laterality Date   no prior surgery      Family History  Problem Relation Age of Onset   Cancer Mother 19       breast   Hypertension Mother    Cancer Maternal Grandmother 34       breast   Prostate cancer Maternal Grandfather    Early death Neg Hx    Stroke Neg Hx    Colon cancer Neg Hx    Breast cancer Neg Hx     Social History   Socioeconomic History   Marital status: Single    Spouse name: Not on file   Number of children: Not on file   Years of education: 12   Highest education level: Not on file  Occupational History   Not on file  Tobacco Use   Smoking status: Never   Smokeless tobacco: Never  Substance and Sexual Activity   Alcohol use: No   Drug use: No   Sexual activity: Not Currently  Other Topics Concern   Not on file  Social History Narrative   Regular exercise-yes   Caffeine Use-no   Social Determinants of Health   Financial Resource Strain: Not on file  Food Insecurity: Not on file  Transportation Needs: Not on file  Physical Activity: Not on file  Stress: Not on file  Social Connections: Not on file  Intimate Partner Violence: Not on file    Outpatient Medications Prior to Visit  Medication Sig Dispense Refill   lisinopril-hydrochlorothiazide (ZESTORETIC) 20-25 MG tablet Take 1 tablet by mouth daily. 90 tablet 2   potassium  chloride (KLOR-CON) 10 MEQ tablet TAKE 1 TABLET(10 MEQ) BY MOUTH TWICE DAILY 180 tablet 2   simvastatin (ZOCOR) 10 MG tablet TAKE 1 TABLET(10 MG) BY MOUTH AT BEDTIME 90 tablet 2   Vitamin D, Ergocalciferol, (DRISDOL) 1.25 MG (50000 UNIT) CAPS capsule Take 1 capsule (50,000 Units total) by mouth every 7 (seven) days. 12 capsule 0   No facility-administered medications prior to visit.    No Known Allergies  ROS Review of Systems  Constitutional:  Negative for chills and fever.  Eyes:  Negative for visual disturbance.  Respiratory:  Negative for cough and shortness of breath.   Cardiovascular:  Negative for chest pain, palpitations and leg swelling.  Gastrointestinal:  Negative for diarrhea, nausea and vomiting.  Neurological:  Negative for dizziness, weakness, light-headedness and headaches.     Objective:    Physical Exam Vitals and nursing note reviewed.  Constitutional:      Appearance: Normal appearance.  Cardiovascular:     Rate and Rhythm: Normal rate and regular rhythm.  Pulmonary:     Effort: Pulmonary effort is normal.     Breath sounds: Normal breath sounds.  Abdominal:     General: Bowel sounds are normal.  Musculoskeletal:     Right lower leg: No edema.     Left lower leg: No edema.  Skin:         Comments: Noticed a cyst to left upper chest medially  Neurological:     Mental Status: She is alert.  Psychiatric:        Mood and Affect: Mood normal.        Behavior: Behavior normal.        Thought Content: Thought content normal.        Judgment: Judgment normal.    BP (!) 180/90   Pulse 94   Temp (!) 97.5 F (36.4 C)   Resp 12   Ht 4' 11.75" (1.518 m)   Wt 151 lb (68.5 kg)   LMP 08/13/2014   SpO2 97%   BMI 29.74 kg/m  Wt Readings from Last 3 Encounters:  09/08/21 151 lb (68.5 kg)  08/25/21 153 lb 7 oz (69.6 kg)  08/18/19 160 lb (72.6 kg)     Health Maintenance Due  Topic Date Due   COVID-19 Vaccine (1) Never done   Hepatitis C Screening   Never done   Zoster Vaccines- Shingrix (1 of 2) Never done   PAP SMEAR-Modifier  01/10/2020    There are no preventive care reminders to display for this patient.  Lab Results  Component Value Date   TSH 3.96 05/08/2013   Lab Results  Component Value Date   WBC 5.4 08/25/2021   HGB 10.8 (L) 08/25/2021   HCT 33.4 (L) 08/25/2021   MCV 87.6 08/25/2021   PLT 230.0 08/25/2021   Lab Results  Component Value Date   NA 143 08/25/2021   K 3.4 (L) 08/25/2021   CO2 32 08/25/2021   GLUCOSE 99 08/25/2021   BUN 14 08/25/2021   CREATININE 1.07 08/25/2021   BILITOT 0.5 08/25/2021   ALKPHOS 55 08/25/2021   AST 17 08/25/2021   ALT 17 08/25/2021   PROT 6.9 08/25/2021   ALBUMIN 4.3 08/25/2021   CALCIUM 9.9 08/25/2021   GFR 56.66 (L) 08/25/2021   Lab Results  Component Value Date   CHOL 261 (H) 08/25/2021   Lab Results  Component Value Date   HDL 62.70 08/25/2021   Lab Results  Component Value Date   LDLCALC 100 (H) 12/02/2019   Lab Results  Component Value Date   TRIG 210.0 (H) 08/25/2021   Lab Results  Component Value Date   CHOLHDL 4 08/25/2021   Lab Results  Component Value Date   HGBA1C 4.7 05/08/2013      Assessment & Plan:   Problem List Items Addressed This Visit       Cardiovascular and Mediastinum   Uncontrolled hypertension - Primary   Relevant Medications   amLODipine (NORVASC) 5 MG tablet   Other Relevant Orders   Comprehensive metabolic panel    No orders of the defined types were placed in this encounter.   Follow-up: Return in about 12 weeks (around 12/01/2021) for Make lab appointment an office visit. BP and cholesterol recheck.   This visit occurred during the SARS-CoV-2 public health emergency.  Safety protocols were in place, including screening questions prior to the visit, additional usage of staff PPE, and extensive cleaning of exam room while observing appropriate contact time as indicated for disinfecting solutions.   Audria Nine,  NP

## 2021-09-13 NOTE — Addendum Note (Signed)
Addended by: Consuella Lose on: 09/13/2021 04:05 PM   Modules accepted: Orders

## 2021-11-19 ENCOUNTER — Other Ambulatory Visit: Payer: Self-pay | Admitting: Nurse Practitioner

## 2021-11-19 DIAGNOSIS — I10 Essential (primary) hypertension: Secondary | ICD-10-CM

## 2021-12-01 ENCOUNTER — Ambulatory Visit: Payer: 59 | Admitting: Nurse Practitioner

## 2021-12-01 ENCOUNTER — Other Ambulatory Visit: Payer: 59

## 2021-12-08 ENCOUNTER — Ambulatory Visit: Payer: Self-pay | Admitting: Nurse Practitioner

## 2022-05-18 ENCOUNTER — Other Ambulatory Visit: Payer: Self-pay | Admitting: Nurse Practitioner

## 2022-05-18 DIAGNOSIS — E876 Hypokalemia: Secondary | ICD-10-CM

## 2022-05-18 DIAGNOSIS — I10 Essential (primary) hypertension: Secondary | ICD-10-CM

## 2022-05-18 NOTE — Telephone Encounter (Signed)
Patient advised. Patient stated with her work shifts she does not know when she can come in and she would need to  call back. I advised patient we will provide 30 day supply but she would need to be seen for any more refills. Patient verbalized understanding.

## 2022-06-01 ENCOUNTER — Encounter: Payer: Self-pay | Admitting: Nurse Practitioner

## 2022-06-01 ENCOUNTER — Ambulatory Visit (INDEPENDENT_AMBULATORY_CARE_PROVIDER_SITE_OTHER): Payer: Self-pay | Admitting: Nurse Practitioner

## 2022-06-01 VITALS — BP 156/84 | HR 82 | Temp 96.9°F | Resp 14 | Ht 59.75 in | Wt 160.5 lb

## 2022-06-01 DIAGNOSIS — E7800 Pure hypercholesterolemia, unspecified: Secondary | ICD-10-CM

## 2022-06-01 DIAGNOSIS — E876 Hypokalemia: Secondary | ICD-10-CM

## 2022-06-01 DIAGNOSIS — T502X5A Adverse effect of carbonic-anhydrase inhibitors, benzothiadiazides and other diuretics, initial encounter: Secondary | ICD-10-CM

## 2022-06-01 DIAGNOSIS — I10 Essential (primary) hypertension: Secondary | ICD-10-CM

## 2022-06-01 LAB — CBC
HCT: 31.7 % — ABNORMAL LOW (ref 36.0–46.0)
Hemoglobin: 10.3 g/dL — ABNORMAL LOW (ref 12.0–15.0)
MCHC: 32.4 g/dL (ref 30.0–36.0)
MCV: 87.8 fl (ref 78.0–100.0)
Platelets: 225 10*3/uL (ref 150.0–400.0)
RBC: 3.61 Mil/uL — ABNORMAL LOW (ref 3.87–5.11)
RDW: 13.1 % (ref 11.5–15.5)
WBC: 6.3 10*3/uL (ref 4.0–10.5)

## 2022-06-01 LAB — COMPREHENSIVE METABOLIC PANEL
ALT: 17 U/L (ref 0–35)
AST: 18 U/L (ref 0–37)
Albumin: 4.5 g/dL (ref 3.5–5.2)
Alkaline Phosphatase: 45 U/L (ref 39–117)
BUN: 23 mg/dL (ref 6–23)
CO2: 27 mEq/L (ref 19–32)
Calcium: 9.8 mg/dL (ref 8.4–10.5)
Chloride: 107 mEq/L (ref 96–112)
Creatinine, Ser: 1.11 mg/dL (ref 0.40–1.20)
GFR: 53.93 mL/min — ABNORMAL LOW (ref >60.00)
Glucose, Bld: 85 mg/dL (ref 70–99)
Potassium: 3.4 mEq/L — ABNORMAL LOW (ref 3.5–5.1)
Sodium: 143 mEq/L (ref 135–145)
Total Bilirubin: 0.5 mg/dL (ref 0.2–1.2)
Total Protein: 7.5 g/dL (ref 6.0–8.3)

## 2022-06-01 LAB — LIPID PANEL
Cholesterol: 202 mg/dL — ABNORMAL HIGH (ref 0–200)
HDL: 58.4 mg/dL (ref >39.00)
NonHDL: 143.6
Total CHOL/HDL Ratio: 3
Triglycerides: 227 mg/dL — ABNORMAL HIGH (ref 0.0–149.0)
VLDL: 45.4 mg/dL — ABNORMAL HIGH (ref 0.0–40.0)

## 2022-06-01 LAB — LDL CHOLESTEROL, DIRECT: Direct LDL: 95 mg/dL

## 2022-06-01 NOTE — Assessment & Plan Note (Signed)
She was placed on lipid-lowering therapy.  Per reports she is taking it.  We will recheck lipids today patient did states she was fasting

## 2022-06-01 NOTE — Patient Instructions (Signed)
Nice to see you today You should be on 2 blood pressure pills. Amlodipine and lisinopril-hydrochlorothiazide. These are every day medications. I also want you to check your blood pressure 3 times a week at home Follow up with me in 6 weeks

## 2022-06-01 NOTE — Progress Notes (Signed)
Established Patient Office Visit  Subjective   Patient ID: Anita Ramirez, female    DOB: September 04, 1961  Age: 61 y.o. MRN: [id number]  Chief Complaint  Patient presents with   Hypertension    Follow up-checks b/p sometimes-readings vary      HTN: One or two days at home. Tolerating medication well.  Patient is unsure if she is taking both medications as she is post to she does not recognize the names but does know what the pill looks like.  HLD: States that she is taking the medication. States that she is exercising. States that she will do set ups 3 times a week for 5 mins  2 meals a day Lunch and dinner. No snack. Sometimes water and soda (mostly)   Colonoscopy: repeat in 10 years 2024  Pap Smear: needs updating   Mammogram: 08/18/2021, due 08/18/2022.     Review of Systems  Constitutional:  Negative for chills and fever.  Respiratory:  Negative for cough and shortness of breath.   Cardiovascular:  Negative for chest pain and leg swelling.  Gastrointestinal:  Negative for abdominal pain, constipation, diarrhea, nausea and vomiting.       BM daily   Genitourinary:  Negative for dysuria and hematuria.  Neurological:  Negative for dizziness and headaches.  Psychiatric/Behavioral:  Negative for hallucinations and suicidal ideas.       Objective:     BP (!) 156/84   Pulse 82   Temp (!) 96.9 F (36.1 C)   Resp 14   Ht 4' 11.75" (1.518 m)   Wt 160 lb 8 oz (72.8 kg)   LMP 08/13/2014   SpO2 96%   BMI 31.61 kg/m    Physical Exam Vitals and nursing note reviewed.  Constitutional:      Appearance: Normal appearance.  HENT:     Mouth/Throat:     Mouth: Mucous membranes are moist.     Pharynx: Oropharynx is clear.  Eyes:     Extraocular Movements: Extraocular movements intact.     Pupils: Pupils are equal, round, and reactive to light.  Cardiovascular:     Rate and Rhythm: Normal rate and regular rhythm.     Pulses: Normal pulses.     Heart sounds: Normal  heart sounds.  Pulmonary:     Effort: Pulmonary effort is normal.     Breath sounds: Normal breath sounds.  Musculoskeletal:     Right lower leg: No edema.     Left lower leg: No edema.  Neurological:     Mental Status: She is alert.      No results found for any visits on 06/01/22.    The 10-year ASCVD risk score (Arnett DK, et al., 2019) is: 14%    Assessment & Plan:   Problem List Items Addressed This Visit       Cardiovascular and Mediastinum   Uncontrolled hypertension - Primary    Patient specially lisinopril-HCTZ 20-25 mg.  Also on amlodipine 5 mg.  Unsure of patient's adherence to medical therapy currently.  Hesitant to make a change in medication as I do not think patient is taking both blood pressure medication as prescribed.  Did reviewed with patient noted on her AVS will have her follow-up in 6 weeks      Relevant Orders   CBC   Comprehensive metabolic panel     Other   Hyperlipemia    She was placed on lipid-lowering therapy.  Per reports she is taking it.  We  will recheck lipids today patient did states she was fasting      Relevant Orders   Lipid panel   Diuretic-induced hypokalemia    Pending BMP today       Return in about 6 weeks (around 07/13/2022) for Bp recheck .    Audria Nine, NP

## 2022-06-01 NOTE — Assessment & Plan Note (Signed)
Pending BMP today

## 2022-06-01 NOTE — Assessment & Plan Note (Signed)
Patient specially lisinopril-HCTZ 20-25 mg.  Also on amlodipine 5 mg.  Unsure of patient's adherence to medical therapy currently.  Hesitant to make a change in medication as I do not think patient is taking both blood pressure medication as prescribed.  Did reviewed with patient noted on her AVS will have her follow-up in 6 weeks

## 2022-06-02 ENCOUNTER — Other Ambulatory Visit: Payer: Self-pay | Admitting: Nurse Practitioner

## 2022-06-02 DIAGNOSIS — E876 Hypokalemia: Secondary | ICD-10-CM

## 2022-06-02 MED ORDER — POTASSIUM CHLORIDE CRYS ER 20 MEQ PO TBCR: 40.0000 meq | EXTENDED_RELEASE_TABLET | Freq: Once | ORAL | 0 refills | Status: DC

## 2022-07-13 ENCOUNTER — Ambulatory Visit (INDEPENDENT_AMBULATORY_CARE_PROVIDER_SITE_OTHER): Payer: Self-pay | Admitting: Nurse Practitioner

## 2022-07-13 VITALS — BP 132/74 | HR 86 | Temp 96.9°F | Resp 12 | Ht 59.75 in | Wt 156.4 lb

## 2022-07-13 DIAGNOSIS — I10 Essential (primary) hypertension: Secondary | ICD-10-CM

## 2022-07-13 NOTE — Progress Notes (Signed)
   Established Patient Office Visit  Subjective   Patient ID: Anita Ramirez, female    DOB: Aug 24, 1961  Age: 61 y.o. MRN: [id number]  Chief Complaint  Patient presents with   Hypertension    Follow up-this morning at home b/p was 138/92-usually readings have been under 140 over 91 or 92.      HTN: States that she will check this morning and it was 134/91 ro 92. Will check it at home and states tolerating the medicaiton fine  No symptoms in regards to her blood pressure.    Review of Systems  Constitutional:  Negative for chills and fever.  Eyes:  Negative for blurred vision and double vision.  Respiratory:  Negative for shortness of breath.   Cardiovascular:  Negative for chest pain and leg swelling.  Neurological:  Negative for dizziness and headaches.      Objective:     BP 132/74   Pulse 86   Temp (!) 96.9 F (36.1 C)   Resp 12   Ht 4' 11.75" (1.518 m)   Wt 156 lb 6 oz (70.9 kg)   LMP 08/13/2014   SpO2 98%   BMI 30.80 kg/m  BP Readings from Last 3 Encounters:  07/13/22 132/74  06/01/22 (!) 156/84  09/08/21 (!) 164/92   Wt Readings from Last 3 Encounters:  07/13/22 156 lb 6 oz (70.9 kg)  06/01/22 160 lb 8 oz (72.8 kg)  09/08/21 151 lb (68.5 kg)      Physical Exam Vitals and nursing note reviewed.  Constitutional:      Appearance: Normal appearance.  Cardiovascular:     Rate and Rhythm: Normal rate and regular rhythm.     Heart sounds: Normal heart sounds.  Pulmonary:     Effort: Pulmonary effort is normal.     Breath sounds: Normal breath sounds.  Abdominal:     General: Bowel sounds are normal.  Musculoskeletal:     Right lower leg: No edema.     Left lower leg: No edema.  Neurological:     Mental Status: She is alert.      No results found for any visits on 07/13/22.    The 10-year ASCVD risk score (Arnett DK, et al., 2019) is: 7.2%    Assessment & Plan:   Problem List Items Addressed This Visit       Cardiovascular and  Mediastinum   Primary hypertension - Primary    Patient's blood pressure within normal limits in office today.  She has been checking blood pressure periodically at home with readings within normal limits.  Did give parameters with patient if systolic greater than 140 or diastolic 90 or greater consistently to let the office know.  Continue taking medication as prescribed she is currently on lisinopril-hydrochlorothiazide and amlodipine.  Tolerating medications well per patient report       Return in about 4 months (around 11/12/2022) for CPE with PAP.    Audria Nine, NP

## 2022-07-13 NOTE — Assessment & Plan Note (Signed)
Patient's blood pressure within normal limits in office today.  She has been checking blood pressure periodically at home with readings within normal limits.  Did give parameters with patient if systolic greater than 140 or diastolic 90 or greater consistently to let the office know.  Continue taking medication as prescribed she is currently on lisinopril-hydrochlorothiazide and amlodipine.  Tolerating medications well per patient report

## 2022-07-13 NOTE — Patient Instructions (Signed)
Nice to see you today You are doing good with your blood pressure. Continue taking the medications as prescribed.  I want to see you in about 4-5 months for your physical with a PAP smear. Follow up sooner if you need me I still want you to check your blood pressure at home approx 3 times a week

## 2022-08-05 ENCOUNTER — Other Ambulatory Visit: Payer: Self-pay | Admitting: Nurse Practitioner

## 2022-08-05 DIAGNOSIS — T502X5A Adverse effect of carbonic-anhydrase inhibitors, benzothiadiazides and other diuretics, initial encounter: Secondary | ICD-10-CM

## 2022-08-09 ENCOUNTER — Other Ambulatory Visit: Payer: Self-pay | Admitting: Nurse Practitioner

## 2022-09-04 ENCOUNTER — Other Ambulatory Visit: Payer: Self-pay | Admitting: Nurse Practitioner

## 2022-09-04 DIAGNOSIS — Z1231 Encounter for screening mammogram for malignant neoplasm of breast: Secondary | ICD-10-CM

## 2022-09-07 ENCOUNTER — Ambulatory Visit
Admission: RE | Admit: 2022-09-07 | Discharge: 2022-09-07 | Disposition: A | Discharge status: Home / Self Care | Payer: Self-pay | Source: Ambulatory Visit | Attending: Nurse Practitioner | Admitting: Nurse Practitioner

## 2022-09-07 DIAGNOSIS — Z1231 Encounter for screening mammogram for malignant neoplasm of breast: Secondary | ICD-10-CM

## 2022-09-13 ENCOUNTER — Other Ambulatory Visit: Payer: Self-pay | Admitting: Nurse Practitioner

## 2022-09-13 DIAGNOSIS — R928 Other abnormal and inconclusive findings on diagnostic imaging of breast: Secondary | ICD-10-CM

## 2022-10-25 ENCOUNTER — Ambulatory Visit
Admission: RE | Admit: 2022-10-25 | Discharge: 2022-10-25 | Disposition: A | Discharge status: Home / Self Care | Payer: Self-pay | Source: Ambulatory Visit | Attending: Nurse Practitioner | Admitting: Nurse Practitioner

## 2022-10-25 ENCOUNTER — Ambulatory Visit
Admission: RE | Admit: 2022-10-25 | Discharge: 2022-10-25 | Disposition: A | Discharge status: Home / Self Care | Payer: Commercial Managed Care - HMO | Source: Ambulatory Visit | Attending: Nurse Practitioner | Admitting: Nurse Practitioner

## 2022-10-25 ENCOUNTER — Other Ambulatory Visit: Payer: Self-pay | Admitting: Nurse Practitioner

## 2022-10-25 DIAGNOSIS — R928 Other abnormal and inconclusive findings on diagnostic imaging of breast: Secondary | ICD-10-CM

## 2022-10-25 DIAGNOSIS — N6310 Unspecified lump in the right breast, unspecified quadrant: Secondary | ICD-10-CM

## 2022-11-03 ENCOUNTER — Ambulatory Visit
Admission: RE | Admit: 2022-11-03 | Discharge: 2022-11-03 | Disposition: A | Discharge status: Home / Self Care | Payer: Commercial Managed Care - HMO | Source: Ambulatory Visit | Attending: Nurse Practitioner | Admitting: Nurse Practitioner

## 2022-11-03 ENCOUNTER — Other Ambulatory Visit: Payer: Self-pay | Admitting: Nurse Practitioner

## 2022-11-03 DIAGNOSIS — N6310 Unspecified lump in the right breast, unspecified quadrant: Secondary | ICD-10-CM

## 2022-11-03 DIAGNOSIS — R928 Other abnormal and inconclusive findings on diagnostic imaging of breast: Secondary | ICD-10-CM

## 2022-11-03 DIAGNOSIS — E785 Hyperlipidemia, unspecified: Secondary | ICD-10-CM

## 2022-11-03 HISTORY — PX: BREAST BIOPSY: SHX20

## 2022-11-16 ENCOUNTER — Other Ambulatory Visit (HOSPITAL_COMMUNITY)
Admission: RE | Admit: 2022-11-16 | Discharge: 2022-11-16 | Disposition: A | Discharge status: Home / Self Care | Payer: Commercial Managed Care - HMO | Source: Ambulatory Visit | Attending: Nurse Practitioner | Admitting: Nurse Practitioner

## 2022-11-16 ENCOUNTER — Ambulatory Visit (INDEPENDENT_AMBULATORY_CARE_PROVIDER_SITE_OTHER): Payer: Commercial Managed Care - HMO | Admitting: Nurse Practitioner

## 2022-11-16 ENCOUNTER — Encounter: Payer: Self-pay | Admitting: Nurse Practitioner

## 2022-11-16 VITALS — BP 132/78 | HR 75 | Temp 97.8°F | Resp 16 | Ht 59.75 in | Wt 156.4 lb

## 2022-11-16 DIAGNOSIS — I10 Essential (primary) hypertension: Secondary | ICD-10-CM | POA: Diagnosis not present

## 2022-11-16 DIAGNOSIS — Z131 Encounter for screening for diabetes mellitus: Secondary | ICD-10-CM | POA: Diagnosis not present

## 2022-11-16 DIAGNOSIS — Z124 Encounter for screening for malignant neoplasm of cervix: Secondary | ICD-10-CM | POA: Diagnosis present

## 2022-11-16 DIAGNOSIS — Z0000 Encounter for general adult medical examination without abnormal findings: Secondary | ICD-10-CM | POA: Diagnosis not present

## 2022-11-16 DIAGNOSIS — Z6830 Body mass index (BMI) 30.0-30.9, adult: Secondary | ICD-10-CM

## 2022-11-16 DIAGNOSIS — E559 Vitamin D deficiency, unspecified: Secondary | ICD-10-CM

## 2022-11-16 DIAGNOSIS — E669 Obesity, unspecified: Secondary | ICD-10-CM | POA: Diagnosis not present

## 2022-11-16 DIAGNOSIS — E785 Hyperlipidemia, unspecified: Secondary | ICD-10-CM | POA: Diagnosis not present

## 2022-11-16 LAB — HEMOGLOBIN A1C: Hgb A1c MFr Bld: 5.1 % (ref 4.6–6.5)

## 2022-11-16 LAB — COMPREHENSIVE METABOLIC PANEL
ALT: 18 U/L (ref 0–35)
AST: 16 U/L (ref 0–37)
Albumin: 4.5 g/dL (ref 3.5–5.2)
Alkaline Phosphatase: 47 U/L (ref 39–117)
BUN: 23 mg/dL (ref 6–23)
CO2: 29 mEq/L (ref 19–32)
Calcium: 9.9 mg/dL (ref 8.4–10.5)
Chloride: 104 mEq/L (ref 96–112)
Creatinine, Ser: 1.23 mg/dL — ABNORMAL HIGH (ref 0.40–1.20)
GFR: 47.53 mL/min — ABNORMAL LOW (ref >60.00)
Glucose, Bld: 106 mg/dL — ABNORMAL HIGH (ref 70–99)
Potassium: 3.2 mEq/L — ABNORMAL LOW (ref 3.5–5.1)
Sodium: 141 mEq/L (ref 135–145)
Total Bilirubin: 0.5 mg/dL (ref 0.2–1.2)
Total Protein: 7.4 g/dL (ref 6.0–8.3)

## 2022-11-16 LAB — CBC
HCT: 32.3 % — ABNORMAL LOW (ref 36.0–46.0)
Hemoglobin: 10.4 g/dL — ABNORMAL LOW (ref 12.0–15.0)
MCHC: 32.2 g/dL (ref 30.0–36.0)
MCV: 87.2 fl (ref 78.0–100.0)
Platelets: 254 10*3/uL (ref 150.0–400.0)
RBC: 3.7 Mil/uL — ABNORMAL LOW (ref 3.87–5.11)
RDW: 12.8 % (ref 11.5–15.5)
WBC: 6.5 10*3/uL (ref 4.0–10.5)

## 2022-11-16 LAB — LIPID PANEL
Cholesterol: 236 mg/dL — ABNORMAL HIGH (ref 0–200)
HDL: 65.9 mg/dL (ref >39.00)
NonHDL: 170.3
Total CHOL/HDL Ratio: 4
Triglycerides: 293 mg/dL — ABNORMAL HIGH (ref 0.0–149.0)
VLDL: 58.6 mg/dL — ABNORMAL HIGH (ref 0.0–40.0)

## 2022-11-16 LAB — LDL CHOLESTEROL, DIRECT: Direct LDL: 122 mg/dL

## 2022-11-16 NOTE — Assessment & Plan Note (Signed)
Pending labs

## 2022-11-16 NOTE — Assessment & Plan Note (Signed)
Maintained on amlodipine and lisinopril-hctz. BP wnl

## 2022-11-16 NOTE — Patient Instructions (Addendum)
Nice to see you today I will be in touch with the labs once I have them Follow up with me in 1 year for your next physical, sooner if you need me  Consider getting the shingles vaccine

## 2022-11-16 NOTE — Assessment & Plan Note (Signed)
Continue working on healthy lifestyle modifications 

## 2022-11-16 NOTE — Progress Notes (Signed)
Established Patient Office Visit  Subjective   Patient ID: Anita Ramirez, female    DOB: 25-Nov-1960  Age: 61 y.o. MRN: [id number]  Chief Complaint  Patient presents with   Hypertension   Annual Exam      HTN: currently maintained on amlodipine and lisinopril-hctz. Tolerates her medications well   HLD: currently on simvastatin. Tolerates her medication well   for complete physical and follow up of chronic conditions.  Immunizations: -Tetanus: 2014 -Influenza: UTD 09/04/2022 -Shingles: information given  -Pneumonia: too young - Covid: 09/04/2022  -HPV: aged out  Diet: Fair diet. States 2 meals a day with some snacking and at night. Water through the day Exercise: No regular exercise. Walking at work and sometimes out of work   Eye exam:  Use to wears glasses Dental exam:  needs updating    Pap Smear: Completed in today  Mammogram: Completed in 09/07/2022  Colonoscopy: Completed in 2014, recall 10 years Lung Cancer Screening: NA  Dexa: too young  Sleep: States that she goes to bed around 2 am and gets up around 9 when she has to work. Feels rested. Does not snore      Review of Systems  Constitutional:  Negative for chills and fever.  Respiratory:  Negative for shortness of breath.   Cardiovascular:  Negative for chest pain.  Gastrointestinal:  Negative for abdominal pain, blood in stool, constipation, diarrhea, nausea and vomiting.       BM daily  Genitourinary:  Negative for dysuria and hematuria.  Neurological:  Negative for dizziness and headaches.  Psychiatric/Behavioral:  Negative for hallucinations and suicidal ideas.       Objective:     BP 132/78   Pulse 75   Temp 97.8 F (36.6 C)   Resp 16   Ht 4' 11.75" (1.518 m)   Wt 156 lb 6 oz (70.9 kg)   LMP 08/13/2014   SpO2 98%   BMI 30.80 kg/m    Physical Exam Vitals and nursing note reviewed. Exam conducted with a chaperone present Tresa Endo Fiscal, CMA).  Constitutional:       Appearance: Normal appearance.  HENT:     Right Ear: Tympanic membrane, ear canal and external ear normal.     Left Ear: Tympanic membrane, ear canal and external ear normal.     Mouth/Throat:     Mouth: Mucous membranes are moist.     Pharynx: Oropharynx is clear.  Eyes:     Extraocular Movements: Extraocular movements intact.     Pupils: Pupils are equal, round, and reactive to light.  Cardiovascular:     Rate and Rhythm: Normal rate and regular rhythm.     Heart sounds: Normal heart sounds.  Pulmonary:     Effort: Pulmonary effort is normal.     Breath sounds: Normal breath sounds.  Abdominal:     General: Bowel sounds are normal. There is no distension.     Palpations: There is no mass.     Tenderness: There is no abdominal tenderness.     Hernia: No hernia is present.  Genitourinary:    Exam position: Lithotomy position.     Vagina: Normal.     Cervix: Normal.     Uterus: Normal.      Adnexa: Right adnexa normal and left adnexa normal.  Musculoskeletal:     Right lower leg: No edema.     Left lower leg: No edema.  Lymphadenopathy:     Cervical: No cervical adenopathy.  Skin:  General: Skin is warm.  Neurological:     General: No focal deficit present.     Mental Status: She is alert.     Deep Tendon Reflexes:     Reflex Scores:      Bicep reflexes are 2+ on the right side and 2+ on the left side.      Patellar reflexes are 2+ on the right side and 2+ on the left side.    Comments: Bilateral upper and lower extremity strength 5/5  Psychiatric:        Mood and Affect: Mood normal.        Behavior: Behavior normal.        Thought Content: Thought content normal.        Judgment: Judgment normal.      No results found for any visits on 11/16/22.    The 10-year ASCVD risk score (Arnett DK, et al., 2019) is: 7.7%    Assessment & Plan:   Problem List Items Addressed This Visit       Cardiovascular and Mediastinum   Primary hypertension    Maintained on  amlodipine and lisinopril-hctz. BP wnl      Relevant Orders   CBC   Comprehensive metabolic panel   TSH     Other   Hyperlipemia    Currently on simvastatin. Pending labs      Relevant Orders   Hemoglobin A1c   Lipid panel   Obesity (BMI 30-39.9)    Continue working on healthy lifestyle modifications.      Relevant Orders   Hemoglobin A1c   Lipid panel   Vitamin D deficiency    Pending labs      Relevant Orders   VITAMIN D 25 Hydroxy (Vit-D Deficiency, Fractures)   Preventative health care - Primary    Discussed age-appropriate immunizations and screening exams.  Patient up-to-date on mammogram update Pap smear today patient is up-to-date on colonoscopy.  Patient was given information at discharge in regards to preventative healthcare maintenance with anticipatory guidance for age range.  Discussed shingles vaccine information in office.      Relevant Orders   CBC   Comprehensive metabolic panel   Hemoglobin A1c   TSH   Lipid panel   Screening for cervical cancer   Relevant Orders   Cytology - PAP([place])    Return in about 1 year (around 11/17/2023) for CPE and labs.    Audria Nine, NP

## 2022-11-16 NOTE — Assessment & Plan Note (Signed)
Discussed age-appropriate immunizations and screening exams.  Patient up-to-date on mammogram update Pap smear today patient is up-to-date on colonoscopy.  Patient was given information at discharge in regards to preventative healthcare maintenance with anticipatory guidance for age range.  Discussed shingles vaccine information in office.

## 2022-11-16 NOTE — Assessment & Plan Note (Signed)
Currently on simvastatin. Pending labs

## 2022-11-17 ENCOUNTER — Other Ambulatory Visit: Payer: Self-pay | Admitting: Nurse Practitioner

## 2022-11-17 DIAGNOSIS — E559 Vitamin D deficiency, unspecified: Secondary | ICD-10-CM

## 2022-11-17 LAB — VITAMIN D 25 HYDROXY (VIT D DEFICIENCY, FRACTURES): VITD: 19.75 ng/mL — ABNORMAL LOW (ref 30.00–100.00)

## 2022-11-17 LAB — CYTOLOGY - PAP
Adequacy: ABSENT
Comment: NEGATIVE
Diagnosis: NEGATIVE
High risk HPV: NEGATIVE

## 2022-11-17 LAB — TSH: TSH: 4.31 u[IU]/mL (ref 0.35–5.50)

## 2022-11-17 MED ORDER — VITAMIN D (ERGOCALCIFEROL) 1.25 MG (50000 UNIT) PO CAPS: 50000.0000 [IU] | ORAL_CAPSULE | ORAL | 0 refills | Status: AC

## 2023-02-01 ENCOUNTER — Other Ambulatory Visit: Payer: Self-pay | Admitting: Nurse Practitioner

## 2023-02-01 DIAGNOSIS — T502X5A Adverse effect of carbonic-anhydrase inhibitors, benzothiadiazides and other diuretics, initial encounter: Secondary | ICD-10-CM

## 2023-05-21 ENCOUNTER — Ambulatory Visit: Payer: 59 | Admitting: Nurse Practitioner

## 2023-06-12 ENCOUNTER — Encounter: Payer: Self-pay | Admitting: Nurse Practitioner

## 2023-06-12 ENCOUNTER — Ambulatory Visit (INDEPENDENT_AMBULATORY_CARE_PROVIDER_SITE_OTHER): Payer: 59 | Admitting: Nurse Practitioner

## 2023-06-12 VITALS — BP 140/82 | HR 95 | Temp 97.8°F | Ht 59.5 in | Wt 160.0 lb

## 2023-06-12 DIAGNOSIS — Z1159 Encounter for screening for other viral diseases: Secondary | ICD-10-CM

## 2023-06-12 DIAGNOSIS — I10 Essential (primary) hypertension: Secondary | ICD-10-CM | POA: Diagnosis not present

## 2023-06-12 DIAGNOSIS — E785 Hyperlipidemia, unspecified: Secondary | ICD-10-CM | POA: Diagnosis not present

## 2023-06-12 DIAGNOSIS — E559 Vitamin D deficiency, unspecified: Secondary | ICD-10-CM

## 2023-06-12 DIAGNOSIS — Z1211 Encounter for screening for malignant neoplasm of colon: Secondary | ICD-10-CM

## 2023-06-12 LAB — LIPID PANEL
Cholesterol: 222 mg/dL — ABNORMAL HIGH (ref 0–200)
HDL: 63.8 mg/dL (ref >39.00)
LDL Cholesterol: 125 mg/dL — ABNORMAL HIGH (ref 0–99)
NonHDL: 157.73
Total CHOL/HDL Ratio: 3
Triglycerides: 165 mg/dL — ABNORMAL HIGH (ref 0.0–149.0)
VLDL: 33 mg/dL (ref 0.0–40.0)

## 2023-06-12 LAB — VITAMIN D 25 HYDROXY (VIT D DEFICIENCY, FRACTURES): VITD: 23.15 ng/mL — ABNORMAL LOW (ref 30.00–100.00)

## 2023-06-12 LAB — BASIC METABOLIC PANEL
BUN: 30 mg/dL — ABNORMAL HIGH (ref 6–23)
CO2: 27 mEq/L (ref 19–32)
Calcium: 9.8 mg/dL (ref 8.4–10.5)
Chloride: 105 mEq/L (ref 96–112)
Creatinine, Ser: 1.28 mg/dL — ABNORMAL HIGH (ref 0.40–1.20)
GFR: 45.13 mL/min — ABNORMAL LOW (ref >60.00)
Glucose, Bld: 86 mg/dL (ref 70–99)
Potassium: 3.7 mEq/L (ref 3.5–5.1)
Sodium: 142 mEq/L (ref 135–145)

## 2023-06-12 NOTE — Assessment & Plan Note (Signed)
Currently maintained on simvastatin 10 mg.  Patient's cholesterol had went up we will recheck today if cholesterol still elevated consider increasing to 20 mg.

## 2023-06-12 NOTE — Progress Notes (Signed)
Established Patient Office Visit  Subjective   Patient ID: Anita Ramirez, female    DOB: [birth year]  Age: 62 y.o. MRN: [id number]  Chief Complaint  Patient presents with   Hyperlipidemia      HLD: states that she is not doing any type of exercise but walks a lot with work   2 meals a day once at work one after work. States that she eats out mainly. States that she has been doing baked chicken. Some snacks. States that she does water very little soda   HTN: states that she does check her blood pressure at home. Yesterday it 130/98. Tolerating the medications well      Review of Systems  Constitutional:  Negative for chills and fever.  Respiratory:  Negative for shortness of breath.   Cardiovascular:  Negative for chest pain.  Neurological:  Negative for tingling and headaches.      Objective:     BP (!) 140/82   Pulse 95   Temp 97.8 F (36.6 C) (Temporal)   Ht 4' 11.5" (1.511 m)   Wt 160 lb (72.6 kg)   LMP 08/13/2014   SpO2 97%   BMI 31.78 kg/m  BP Readings from Last 3 Encounters:  06/12/23 (!) 140/82  11/16/22 132/78  07/13/22 132/74   Wt Readings from Last 3 Encounters:  06/12/23 160 lb (72.6 kg)  11/16/22 156 lb 6 oz (70.9 kg)  07/13/22 156 lb 6 oz (70.9 kg)      Physical Exam Vitals and nursing note reviewed.  Constitutional:      Appearance: Normal appearance.  Neck:     Vascular: No carotid bruit.  Cardiovascular:     Rate and Rhythm: Normal rate and regular rhythm.     Heart sounds: Murmur heard.  Pulmonary:     Effort: Pulmonary effort is normal.     Breath sounds: Normal breath sounds.  Musculoskeletal:     Right lower leg: No edema.     Left lower leg: No edema.  Neurological:     Mental Status: She is alert.      No results found for any visits on 06/12/23.    The 10-year ASCVD risk score (Arnett DK, et al., 2019) is: 9.8%    Assessment & Plan:   Problem List Items Addressed This Visit       Cardiovascular and  Mediastinum   Primary hypertension - Primary    Patient currently maintained on amlodipine and hydrochlorothiazide-lisinopril.  Blood pressure borderline today.  Continue taking medication as prescribed continue work on lifestyle modifications.      Relevant Orders   Basic metabolic panel     Other   Hyperlipemia    Currently maintained on simvastatin 10 mg.  Patient's cholesterol had went up we will recheck today if cholesterol still elevated consider increasing to 20 mg.      Relevant Orders   Lipid panel   Vitamin D deficiency    History of the same patient was repleted with prescription pending labs today      Relevant Orders   VITAMIN D 25 Hydroxy (Vit-D Deficiency, Fractures)   Other Visit Diagnoses     Screening for colon cancer       Relevant Orders   Ambulatory referral to Gastroenterology   Encounter for hepatitis C screening test for low risk patient       Relevant Orders   Hepatitis C Antibody       Return in about 6 months (  around 12/13/2023) for CPE and Labs.    Audria Nine, NP

## 2023-06-12 NOTE — Assessment & Plan Note (Signed)
History of the same patient was repleted with prescription pending labs today

## 2023-06-12 NOTE — Assessment & Plan Note (Signed)
Patient currently maintained on amlodipine and hydrochlorothiazide-lisinopril.  Blood pressure borderline today.  Continue taking medication as prescribed continue work on lifestyle modifications.

## 2023-06-12 NOTE — Patient Instructions (Signed)
Nice to see you today I will be in touch with the labs once I have reviewed them Follow up with me in 6 months for your physical and labs, sooner if you need me

## 2023-06-13 LAB — HEPATITIS C ANTIBODY: Hepatitis C Ab: NONREACTIVE

## 2023-06-14 ENCOUNTER — Other Ambulatory Visit: Payer: Self-pay | Admitting: Nurse Practitioner

## 2023-06-14 DIAGNOSIS — E785 Hyperlipidemia, unspecified: Secondary | ICD-10-CM

## 2023-06-14 MED ORDER — SIMVASTATIN 20 MG PO TABS: 20.0000 mg | ORAL_TABLET | Freq: Every day | ORAL | 3 refills | Status: AC

## 2023-07-26 ENCOUNTER — Ambulatory Visit (AMBULATORY_SURGERY_CENTER): Payer: 59 | Admitting: *Deleted

## 2023-07-26 VITALS — Ht 59.5 in | Wt 153.0 lb

## 2023-07-26 DIAGNOSIS — Z1211 Encounter for screening for malignant neoplasm of colon: Secondary | ICD-10-CM

## 2023-07-26 MED ORDER — NA SULFATE-K SULFATE-MG SULF 17.5-3.13-1.6 GM/177ML PO SOLN: 1.0000 | Freq: Once | ORAL | 0 refills | Status: AC

## 2023-07-26 NOTE — Progress Notes (Signed)
Pt's name and DOB verified at the beginning of the pre-visit.  Pt denies any difficulty with ambulating,sitting, laying down or rolling side to side Gave both [facility] main # and MD on call # prior to instructions.  No egg or soy allergy known to patient  No issues known to pt with past sedation with any surgeries or procedures Pt has no issues moving head neck or swallowing No FH of Malignant Hyperthermia Pt is not on diet pills Pt is not on home 02  Pt is not on blood thinners  Pt denies issues with constipation  Pt is not on dialysis Pt denise any abnormal heart rhythms  Pt denies any upcoming cardiac testing Pt encouraged to use to use Singlecare or Goodrx to reduce cost  Patient's chart reviewed by Cathlyn Parsons CNRA prior to pre-visit and patient appropriate for the [facility].  Pre-visit completed and red dot placed by patient's name on their procedure day (on provider's schedule).  . Visit by phone Pt scaleweight is 153 lb Instructed pt why it is important to and  to call if they have any changes in health or new medications. Directed them to the # given and on instructions.   Pt states they will.  Instructions reviewed with pt and pt states understanding. Instructed to review again prior to procedure. Pt states they will.  Instructions given to pt with coupon and by my chart

## 2023-08-01 ENCOUNTER — Encounter: Payer: Self-pay | Admitting: Nurse Practitioner

## 2023-08-01 DIAGNOSIS — Z1231 Encounter for screening mammogram for malignant neoplasm of breast: Secondary | ICD-10-CM

## 2023-08-02 ENCOUNTER — Encounter: Payer: Self-pay | Admitting: Internal Medicine

## 2023-08-02 ENCOUNTER — Other Ambulatory Visit: Payer: Self-pay | Admitting: Nurse Practitioner

## 2023-08-02 DIAGNOSIS — Z1231 Encounter for screening mammogram for malignant neoplasm of breast: Secondary | ICD-10-CM

## 2023-08-15 ENCOUNTER — Encounter: Payer: Self-pay | Admitting: Internal Medicine

## 2023-08-15 ENCOUNTER — Ambulatory Visit: Payer: 59 | Admitting: Internal Medicine

## 2023-08-15 VITALS — BP 173/80 | HR 72 | Temp 97.3°F | Resp 14 | Ht 59.5 in | Wt 153.0 lb

## 2023-08-15 DIAGNOSIS — Z1211 Encounter for screening for malignant neoplasm of colon: Secondary | ICD-10-CM | POA: Diagnosis present

## 2023-08-15 DIAGNOSIS — D122 Benign neoplasm of ascending colon: Secondary | ICD-10-CM | POA: Diagnosis not present

## 2023-08-15 MED ORDER — SODIUM CHLORIDE 0.9 % IV SOLN: 500.0000 mL | Freq: Once | INTRAVENOUS | Status: DC

## 2023-08-15 NOTE — Progress Notes (Signed)
Called to room to assist during endoscopic procedure.  Patient ID and intended procedure confirmed with present staff. Received instructions for my participation in the procedure from the performing physician.  

## 2023-08-15 NOTE — Progress Notes (Signed)
Report to PACU, RN, vss, BBS= Clear.  

## 2023-08-15 NOTE — Progress Notes (Signed)
[facility] History and Physical   Primary Care Physician:  Eden Emms, NP   Reason for Procedure:    Encounter Diagnosis  Name Primary?   Special screening for malignant neoplasms, colon Yes     Plan:    colonoscopy     HPI: LLUVIA GLUTH is a 62 y.o. female here for screening exam, last one was negative in 2014.   Past Medical History:  Diagnosis Date   Anemia    History of chicken pox    Hyperlipemia    Hypertension    Obesity     Past Surgical History:  Procedure Laterality Date   BREAST BIOPSY Right 11/03/2022   [place] RT BREAST BX W LOC DEV 1ST LESION IMG BX SPEC US GUIDE 11/03/2022 [facility]   COLONOSCOPY     no prior surgery      Prior to Admission medications   Medication Sig Start Date End Date Taking? Authorizing Provider  amLODipine (NORVASC) 5 MG tablet TAKE 1 TABLET(5 MG) BY MOUTH DAILY Patient not taking: Reported on 06/12/2023 05/18/22   Eden Emms, NP  lisinopril-hydrochlorothiazide (ZESTORETIC) 20-25 MG tablet TAKE 1 TABLET BY MOUTH DAILY 02/01/23   Eden Emms, NP  simvastatin (ZOCOR) 20 MG tablet Take 1 tablet (20 mg total) by mouth at bedtime. Patient not taking: Reported on 07/26/2023 06/14/23   Eden Emms, NP  Vitamin D, Ergocalciferol, (DRISDOL) 1.25 MG (50000 UNIT) CAPS capsule Take 1 capsule (50,000 Units total) by mouth every 7 (seven) days. 11/17/22   Eden Emms, NP    Current Outpatient Medications  Medication Sig Dispense Refill   amLODipine (NORVASC) 5 MG tablet TAKE 1 TABLET(5 MG) BY MOUTH DAILY (Patient not taking: Reported on 06/12/2023) 30 tablet 0   lisinopril-hydrochlorothiazide (ZESTORETIC) 20-25 MG tablet TAKE 1 TABLET BY MOUTH DAILY 90 tablet 1   simvastatin (ZOCOR) 20 MG tablet Take 1 tablet (20 mg total) by mouth at bedtime. (Patient not taking: Reported on 07/26/2023) 90 tablet 3   Vitamin D, Ergocalciferol, (DRISDOL) 1.25 MG (50000 UNIT) CAPS capsule Take 1 capsule (50,000 Units total) by  mouth every 7 (seven) days. 12 capsule 0   Current Facility-Administered Medications  Medication Dose Route Frequency Provider Last Rate Last Admin   0.9 %  sodium chloride infusion  500 mL Intravenous Once Iva Boop, MD        Allergies as of 08/15/2023   (No Known Allergies)    Family History  Problem Relation Age of Onset   Cancer Mother 22       breast   Hypertension Mother    Pancreatic cancer Maternal Uncle    Cancer Maternal Grandmother 68       breast   Prostate cancer Maternal Grandfather    Early death Neg Hx    Stroke Neg Hx    Colon cancer Neg Hx    Breast cancer Neg Hx    Colon polyps Neg Hx    Esophageal cancer Neg Hx    Rectal cancer Neg Hx    Stomach cancer Neg Hx     Social History   Socioeconomic History   Marital status: Single    Spouse name: Not on file   Number of children: Not on file   Years of education: 12   Highest education level: Not on file  Occupational History   Not on file  Tobacco Use   Smoking status: Never   Smokeless tobacco: Never  Substance and Sexual  Activity   Alcohol use: No   Drug use: No   Sexual activity: Not Currently  Other Topics Concern   Not on file  Social History Narrative   Regular exercise-yes   Caffeine Use-no   Social Determinants of Health   Financial Resource Strain: Not on file  Food Insecurity: Not on file  Transportation Needs: Not on file  Physical Activity: Not on file  Stress: Not on file  Social Connections: Not on file  Intimate Partner Violence: Not on file    Review of Systems:  All other review of systems negative except as mentioned in the HPI.  Physical Exam: Vital signs BP (!) 136/98   Pulse 84   Temp (!) 97.3 F (36.3 C)   Resp 16   Ht 4' 11.5" (1.511 m)   Wt 153 lb (69.4 kg)   LMP 08/13/2014   SpO2 100%   BMI 30.39 kg/m   General:   Alert,  Well-developed, well-nourished, pleasant and cooperative in NAD Lungs:  Clear throughout to auscultation.   Heart:   Regular rate and rhythm; no murmurs, clicks, rubs,  or gallops. Abdomen:  Soft, nontender and nondistended. Normal bowel sounds.   Neuro/Psych:  Alert and cooperative. Normal mood and affect. A and O x 3   @Naama Sappington  Sena Slate, MD, [facility] [phone] (pager) 08/15/2023 10:27 AM@

## 2023-08-15 NOTE — Op Note (Signed)
[facility] Patient Name: Anita Ramirez Procedure Date: 08/15/2023 10:24 AM MRN: [id number] Endoscopist: Iva Boop , MD, [id number] Age: 62 Referring MD:  Date of Birth: [birth year] Gender: Female Account #: [id number] Procedure:                Colonoscopy Indications:              Screening for colorectal malignant neoplasm, Last                            colonoscopy: 2014 Medicines:                Monitored Anesthesia Care Procedure:                Pre-Anesthesia Assessment:                           - Prior to the procedure, a History and Physical                            was performed, and patient medications and                            allergies were reviewed. The patient's tolerance of                            previous anesthesia was also reviewed. The risks                            and benefits of the procedure and the sedation                            options and risks were discussed with the patient.                            All questions were answered, and informed consent                            was obtained. Prior Anticoagulants: The patient has                            taken no anticoagulant or antiplatelet agents. ASA                            Grade Assessment: II - A patient with mild systemic                            disease. After reviewing the risks and benefits,                            the patient was deemed in satisfactory condition to                            undergo the procedure.  After obtaining informed consent, the colonoscope                            was passed under direct vision. Throughout the                            procedure, the patient's blood pressure, pulse, and                            oxygen saturations were monitored continuously. The                            Olympus Scope [id number] was introduced through the                            anus and advanced to the the  cecum, identified by                            appendiceal orifice and ileocecal valve. The                            colonoscopy was performed without difficulty. The                            patient tolerated the procedure well. The quality                            of the bowel preparation was good. The ileocecal                            valve, appendiceal orifice, and rectum were                            photographed. The bowel preparation used was SUPREP                            via split dose instruction. Scope In: 10:33:35 AM Scope Out: 10:47:07 AM Scope Withdrawal Time: 0 hours 8 minutes 52 seconds  Total Procedure Duration: 0 hours 13 minutes 32 seconds  Findings:                 Skin tags were found on perianal exam.                           A 3 mm polyp was found in the ascending colon. The                            polyp was sessile. The polyp was removed with a                            cold snare. Resection and retrieval were complete.                            Verification of patient  identification for the                            specimen was done. Estimated blood loss was minimal.                           A few diverticula were found in the ascending colon                            and cecum.                           The exam was otherwise without abnormality on                            direct and retroflexion views. Complications:            No immediate complications. Estimated Blood Loss:     Estimated blood loss was minimal. Impression:               - Perianal skin tags found on perianal exam.                           - One 3 mm polyp in the ascending colon, removed                            with a cold snare. Resected and retrieved.                           - Diverticulosis in the ascending colon and in the                            cecum.                           - The examination was otherwise normal on direct                             and retroflexion views. Recommendation:           - Patient has a contact number available for                            emergencies. The signs and symptoms of potential                            delayed complications were discussed with the                            patient. Return to normal activities tomorrow.                            Written discharge instructions were provided to the                            patient.                           -  Resume previous diet.                           - Continue present medications.                           - Repeat colonoscopy is recommended. The                            colonoscopy date will be determined after pathology                            results from today's exam become available for                            review. Iva Boop, MD 08/15/2023 10:52:41 AM This report has been signed electronically.

## 2023-08-15 NOTE — Patient Instructions (Addendum)
I found and removed one tiny polyp that looks benign.  You also have a condition called diverticulosis - common and not usually a problem. Please read the handout provided.  I will let you know pathology results and when to have another routine colonoscopy by mail and/or My Chart.  I appreciate the opportunity to care for you. Iva Boop, MD, FACG  YOU HAD AN ENDOSCOPIC PROCEDURE TODAY AT THE [facility]:   Refer to the procedure report that was given to you for any specific questions about what was found during the examination.  If the procedure report does not answer your questions, please call your gastroenterologist to clarify.  If you requested that your care partner not be given the details of your procedure findings, then the procedure report has been included in a sealed envelope for you to review at your convenience later.  YOU SHOULD EXPECT: Some feelings of bloating in the abdomen. Passage of more gas than usual.  Walking can help get rid of the air that was put into your GI tract during the procedure and reduce the bloating. If you had a lower endoscopy (such as a colonoscopy or flexible sigmoidoscopy) you may notice spotting of blood in your stool or on the toilet paper. If you underwent a bowel prep for your procedure, you may not have a normal bowel movement for a few days.  Please Note:  You might notice some irritation and congestion in your nose or some drainage.  This is from the oxygen used during your procedure.  There is no need for concern and it should clear up in a day or so.  SYMPTOMS TO REPORT IMMEDIATELY:  Following lower endoscopy (colonoscopy or flexible sigmoidoscopy):  Excessive amounts of blood in the stool  Significant tenderness or worsening of abdominal pains  Swelling of the abdomen that is new, acute  Fever of 100F or higher  For urgent or emergent issues, a gastroenterologist can be reached at any hour by calling [phone]. Do not  use MyChart messaging for urgent concerns.    DIET:  We do recommend a small meal at first, but then you may proceed to your regular diet.  Drink plenty of fluids but you should avoid alcoholic beverages for 24 hours.  ACTIVITY:  You should plan to take it easy for the rest of today and you should NOT DRIVE or use heavy machinery until tomorrow (because of the sedation medicines used during the test).    FOLLOW UP: Our staff will call the number listed on your records the next business day following your procedure.  We will call around 7:15- 8:00 am to check on you and address any questions or concerns that you may have regarding the information given to you following your procedure. If we do not reach you, we will leave a message.     If any biopsies were taken you will be contacted by phone or by letter within the next 1-3 weeks.  Please call us at [phone] if you have not heard about the biopsies in 3 weeks.    SIGNATURES/CONFIDENTIALITY: You and/or your care partner have signed paperwork which will be entered into your electronic medical record.  These signatures attest to the fact that that the information above on your After Visit Summary has been reviewed and is understood.  Full responsibility of the confidentiality of this discharge information lies with you and/or your care-partner.

## 2023-08-16 ENCOUNTER — Telehealth: Payer: Self-pay | Admitting: *Deleted

## 2023-08-16 NOTE — Telephone Encounter (Signed)
  Follow up Call-     08/15/2023    9:48 AM  Call back number  Post procedure Call Back phone  # [phone]  Permission to leave phone message Yes     Patient questions:  Do you have a fever, pain , or abdominal swelling? No. Pain Score  0 *  Have you tolerated food without any problems? Yes.    Have you been able to return to your normal activities? Yes.    Do you have any questions about your discharge instructions: Diet   No. Medications  No. Follow up visit  No.  Do you have questions or concerns about your Care? No.  Actions: * If pain score is 4 or above: No action needed, pain <4.

## 2023-08-20 LAB — SURGICAL PATHOLOGY

## 2023-08-21 ENCOUNTER — Encounter: Payer: Self-pay | Admitting: Internal Medicine

## 2023-08-21 ENCOUNTER — Telehealth: Payer: Self-pay | Admitting: Nurse Practitioner

## 2023-08-21 DIAGNOSIS — Z860101 Personal history of adenomatous and serrated colon polyps: Secondary | ICD-10-CM | POA: Insufficient documentation

## 2023-08-21 DIAGNOSIS — E876 Hypokalemia: Secondary | ICD-10-CM

## 2023-08-21 NOTE — Telephone Encounter (Signed)
LAST APPOINTMENT DATE: 06/12/2023   NEXT APPOINTMENT DATE: Visit date not found  Lisinopril-Hydrochlorothiazide 20-25mg    LAST REFILL: 02/01/2023  QTY: #90 1RF

## 2023-08-21 NOTE — Telephone Encounter (Signed)
Prescription Request  08/21/2023  LOV: 06/12/2023  What is the name of the medication or equipment? lisinopril-hydrochlorothiazide (ZESTORETIC) 20-25 MG tablet     Have you contacted your pharmacy to request a refill? No   Which pharmacy would you like this sent to?  [id number] - [place], [place] - [address] AT [facility] [address] [place] [address] Phone: [phone] Fax: [phone]    Patient notified that their request is being sent to the clinical staff for review and that they should receive a response within 2 business days.   Please advise at Mobile [phone] (mobile)

## 2023-08-22 MED ORDER — LISINOPRIL-HYDROCHLOROTHIAZIDE 20-25 MG PO TABS: 1.0000 | ORAL_TABLET | Freq: Every day | ORAL | 1 refills | Status: DC

## 2023-08-22 NOTE — Telephone Encounter (Signed)
I am going to refill the medication but can we schedule the patient for a CPE per my last office note please

## 2023-08-22 NOTE — Telephone Encounter (Signed)
Scheduled for CPE

## 2023-09-17 ENCOUNTER — Encounter: Payer: Self-pay | Admitting: Nurse Practitioner

## 2023-09-17 ENCOUNTER — Ambulatory Visit (INDEPENDENT_AMBULATORY_CARE_PROVIDER_SITE_OTHER): Payer: 59 | Admitting: Nurse Practitioner

## 2023-09-17 VITALS — BP 116/84 | HR 81 | Temp 98.3°F | Ht 60.05 in | Wt 154.0 lb

## 2023-09-17 DIAGNOSIS — Z23 Encounter for immunization: Secondary | ICD-10-CM

## 2023-09-17 DIAGNOSIS — E785 Hyperlipidemia, unspecified: Secondary | ICD-10-CM

## 2023-09-17 DIAGNOSIS — Z0000 Encounter for general adult medical examination without abnormal findings: Secondary | ICD-10-CM

## 2023-09-17 DIAGNOSIS — E559 Vitamin D deficiency, unspecified: Secondary | ICD-10-CM | POA: Diagnosis not present

## 2023-09-17 DIAGNOSIS — E669 Obesity, unspecified: Secondary | ICD-10-CM | POA: Diagnosis not present

## 2023-09-17 DIAGNOSIS — I10 Essential (primary) hypertension: Secondary | ICD-10-CM | POA: Diagnosis not present

## 2023-09-17 LAB — CBC
HCT: 32.5 % — ABNORMAL LOW (ref 36.0–46.0)
Hemoglobin: 10 g/dL — ABNORMAL LOW (ref 12.0–15.0)
MCHC: 30.9 g/dL (ref 30.0–36.0)
MCV: 88.5 fL (ref 78.0–100.0)
Platelets: 228 10*3/uL (ref 150.0–400.0)
RBC: 3.67 Mil/uL — ABNORMAL LOW (ref 3.87–5.11)
RDW: 14 % (ref 11.5–15.5)
WBC: 6.2 10*3/uL (ref 4.0–10.5)

## 2023-09-17 LAB — COMPREHENSIVE METABOLIC PANEL
ALT: 15 U/L (ref 0–35)
AST: 14 U/L (ref 0–37)
Albumin: 4.5 g/dL (ref 3.5–5.2)
Alkaline Phosphatase: 50 U/L (ref 39–117)
BUN: 33 mg/dL — ABNORMAL HIGH (ref 6–23)
CO2: 29 meq/L (ref 19–32)
Calcium: 10 mg/dL (ref 8.4–10.5)
Chloride: 106 meq/L (ref 96–112)
Creatinine, Ser: 1.31 mg/dL — ABNORMAL HIGH (ref 0.40–1.20)
GFR: 43.81 mL/min — ABNORMAL LOW (ref >60.00)
Glucose, Bld: 111 mg/dL — ABNORMAL HIGH (ref 70–99)
Potassium: 3.9 meq/L (ref 3.5–5.1)
Sodium: 143 meq/L (ref 135–145)
Total Bilirubin: 0.4 mg/dL (ref 0.2–1.2)
Total Protein: 7.3 g/dL (ref 6.0–8.3)

## 2023-09-17 LAB — HEMOGLOBIN A1C: Hgb A1c MFr Bld: 4.7 % (ref 4.6–6.5)

## 2023-09-17 LAB — LIPID PANEL
Cholesterol: 192 mg/dL (ref 0–200)
HDL: 60.2 mg/dL (ref >39.00)
LDL Cholesterol: 100 mg/dL — ABNORMAL HIGH (ref 0–99)
NonHDL: 131.47
Total CHOL/HDL Ratio: 3
Triglycerides: 156 mg/dL — ABNORMAL HIGH (ref 0.0–149.0)
VLDL: 31.2 mg/dL (ref 0.0–40.0)

## 2023-09-17 LAB — TSH: TSH: 3.26 u[IU]/mL (ref 0.35–5.50)

## 2023-09-17 LAB — VITAMIN D 25 HYDROXY (VIT D DEFICIENCY, FRACTURES): VITD: 44.92 ng/mL (ref 30.00–100.00)

## 2023-09-17 NOTE — Assessment & Plan Note (Signed)
Patient currently maintained on simvastatin 20 mg daily.  Continue pending lipid panel today

## 2023-09-17 NOTE — Assessment & Plan Note (Signed)
Discussed age-appropriate musicians and screening exams.  Patient is up-to-date on all age-appropriate vaccinations we will update flu and shingles vaccine today.  Did review patient's personal, surgical, social, family histories.  Patient is up-to-date on CRC screening.  Mammogram scheduled in the next 3 days.  Patient is up-to-date on cervical cancer screening.  Patient was given information at discharge about preventative healthcare maintenance with anticipatory guidance

## 2023-09-17 NOTE — Patient Instructions (Signed)
Nice to see you today I will be in touch with the labs once I have them Follow up with me in 1 year, sooner if you need me  You will need a nurse visit in 3 months for your second and final shingles vaccine

## 2023-09-17 NOTE — Assessment & Plan Note (Signed)
Continue working on lifestyle modifications.  Pending TSH, A1c, lipid panel today

## 2023-09-17 NOTE — Progress Notes (Signed)
Established Patient Office Visit  Subjective   Patient ID: Anita Ramirez, female    DOB: 08/15/1961  Age: 62 y.o. MRN: [id number]  Chief Complaint  Patient presents with   Annual Exam    Would like flu and shingles    HPI  for complete physical and follow up of chronic conditions.   HTN: Patient currently maintained on lisinopril-hydrochlorothiazide and amlodipine.  Patient tolerating medications well  HLD: Patient currently maintained on Zocor 20 mg daily.   Immunizations: -Tetanus: Completed in 2014 -Influenza: update today  -Shingles:  first one today  -Pneumonia: Too young  Diet: Fair diet. She will have 3 meals a day. She will snack sometimes. Healthy snacks. Water  Exercise: With employment. States she will walk outside of work 1-2 days   Eye exam: PRN  Dental exam: PRN    Colonoscopy: Completed in 08/15/2023, repeat 10 years.  Due 2034 Lung Cancer Screening: N/A  Pap smear: Completed 11/16/2022 with negative HPV and normal cells will be due 2028  Mammogram: Scheduled 09/20/2023  DEXA: Too young  Sleep: she will go to bed around 12 and got up around 8. Does feel rested.       Review of Systems  Constitutional:  Negative for chills and fever.  Respiratory:  Negative for shortness of breath.   Cardiovascular:  Negative for chest pain and leg swelling.  Gastrointestinal:  Negative for abdominal pain, blood in stool, constipation, diarrhea, nausea and vomiting.       Bm every other day   Genitourinary:  Negative for dysuria and hematuria.  Neurological:  Negative for dizziness, tingling and headaches.  Psychiatric/Behavioral:  Negative for hallucinations and suicidal ideas.       Objective:     BP 116/84   Pulse 81   Temp 98.3 F (36.8 C) (Oral)   Ht 5' 0.05" (1.525 m)   Wt 154 lb (69.9 kg)   LMP 08/13/2014   SpO2 98%   BMI 30.03 kg/m  BP Readings from Last 3 Encounters:  09/17/23 116/84  08/15/23 (!) 173/80  06/12/23 (!) 140/82    Wt Readings from Last 3 Encounters:  09/17/23 154 lb (69.9 kg)  08/15/23 153 lb (69.4 kg)  07/26/23 153 lb (69.4 kg)   SpO2 Readings from Last 3 Encounters:  09/17/23 98%  08/15/23 100%  06/12/23 97%      Physical Exam Vitals and nursing note reviewed.  Constitutional:      Appearance: Normal appearance.  HENT:     Right Ear: Tympanic membrane, ear canal and external ear normal.     Left Ear: Tympanic membrane, ear canal and external ear normal.     Mouth/Throat:     Mouth: Mucous membranes are moist.     Pharynx: Oropharynx is clear.  Eyes:     Extraocular Movements: Extraocular movements intact.     Pupils: Pupils are equal, round, and reactive to light.  Cardiovascular:     Rate and Rhythm: Normal rate and regular rhythm.     Pulses: Normal pulses.     Heart sounds: Normal heart sounds.  Pulmonary:     Effort: Pulmonary effort is normal.     Breath sounds: Normal breath sounds.  Abdominal:     General: Bowel sounds are normal. There is no distension.     Palpations: There is no mass.     Tenderness: There is no abdominal tenderness.     Hernia: No hernia is present.  Musculoskeletal:  Right lower leg: No edema.     Left lower leg: No edema.  Lymphadenopathy:     Cervical: No cervical adenopathy.  Skin:    General: Skin is warm.  Neurological:     General: No focal deficit present.     Mental Status: She is alert.     Deep Tendon Reflexes:     Reflex Scores:      Bicep reflexes are 2+ on the right side and 2+ on the left side.      Patellar reflexes are 2+ on the right side and 2+ on the left side.    Comments: Bilateral upper and lower extremity strength 5/5  Psychiatric:        Mood and Affect: Mood normal.        Behavior: Behavior normal.        Thought Content: Thought content normal.        Judgment: Judgment normal.      No results found for any visits on 09/17/23.    The 10-year ASCVD risk score (Arnett DK, et al., 2019) is: 6.1%     Assessment & Plan:   Problem List Items Addressed This Visit       Cardiovascular and Mediastinum   Primary hypertension    Patient currently maintained on lisinopril-hydrochlorothiazide 20 mg - 25 mg.  Along with amlodipine 5 mg daily.  Patient's blood pressure well-controlled.  Tolerating medication well.  Patient denies any lightheaded or dizziness.  Continue medication as prescribed      Relevant Orders   Hemoglobin A1c     Other   Hyperlipemia    Patient currently maintained on simvastatin 20 mg daily.  Continue pending lipid panel today      Relevant Orders   Hemoglobin A1c   Lipid panel   Obesity (BMI 30-39.9)    Continue working on lifestyle modifications.  Pending TSH, A1c, lipid panel today      Relevant Orders   Hemoglobin A1c   Vitamin D deficiency    History of the same has been repleted.  Pending vitamin D level today      Relevant Orders   VITAMIN D 25 Hydroxy (Vit-D Deficiency, Fractures)   Preventative health care - Primary    Discussed age-appropriate musicians and screening exams.  Patient is up-to-date on all age-appropriate vaccinations we will update flu and shingles vaccine today.  Did review patient's personal, surgical, social, family histories.  Patient is up-to-date on CRC screening.  Mammogram scheduled in the next 3 days.  Patient is up-to-date on cervical cancer screening.  Patient was given information at discharge about preventative healthcare maintenance with anticipatory guidance      Relevant Orders   CBC   Comprehensive metabolic panel   TSH   Other Visit Diagnoses     Need for shingles vaccine       Relevant Orders   Zoster Recombinant (Shingrix ) (Completed)   Need for influenza vaccination       Relevant Orders   Flu vaccine trivalent PF, 6mos and older(Flulaval,Afluria,Fluarix,Fluzone) (Completed)       Return in about 1 year (around 09/16/2024) for CPE and Labs.    Audria Nine, NP

## 2023-09-17 NOTE — Assessment & Plan Note (Signed)
History of the same has been repleted.  Pending vitamin D level today

## 2023-09-17 NOTE — Assessment & Plan Note (Signed)
Patient currently maintained on lisinopril-hydrochlorothiazide 20 mg - 25 mg.  Along with amlodipine 5 mg daily.  Patient's blood pressure well-controlled.  Tolerating medication well.  Patient denies any lightheaded or dizziness.  Continue medication as prescribed

## 2023-09-20 ENCOUNTER — Ambulatory Visit
Admission: RE | Admit: 2023-09-20 | Discharge: 2023-09-20 | Disposition: A | Discharge status: Home / Self Care | Payer: 59 | Source: Ambulatory Visit | Attending: Nurse Practitioner | Admitting: Nurse Practitioner

## 2023-09-20 DIAGNOSIS — Z1231 Encounter for screening mammogram for malignant neoplasm of breast: Secondary | ICD-10-CM

## 2023-09-26 ENCOUNTER — Other Ambulatory Visit: Payer: Self-pay | Admitting: Nurse Practitioner

## 2023-09-26 ENCOUNTER — Encounter: Payer: Self-pay | Admitting: Nurse Practitioner

## 2023-09-26 ENCOUNTER — Other Ambulatory Visit: Payer: Self-pay

## 2023-09-26 DIAGNOSIS — R928 Other abnormal and inconclusive findings on diagnostic imaging of breast: Secondary | ICD-10-CM

## 2023-10-30 ENCOUNTER — Emergency Department (HOSPITAL_COMMUNITY)
Admission: EM | Admit: 2023-10-30 | Discharge: 2023-10-30 | Disposition: A | Discharge status: Home / Self Care | Payer: 59 | Attending: Emergency Medicine | Admitting: Emergency Medicine

## 2023-10-30 ENCOUNTER — Other Ambulatory Visit: Payer: Self-pay

## 2023-10-30 ENCOUNTER — Encounter (HOSPITAL_COMMUNITY): Payer: Self-pay

## 2023-10-30 DIAGNOSIS — Z79899 Other long term (current) drug therapy: Secondary | ICD-10-CM | POA: Diagnosis not present

## 2023-10-30 DIAGNOSIS — X58XXXA Exposure to other specified factors, initial encounter: Secondary | ICD-10-CM | POA: Insufficient documentation

## 2023-10-30 DIAGNOSIS — S0093XA Contusion of unspecified part of head, initial encounter: Secondary | ICD-10-CM | POA: Insufficient documentation

## 2023-10-30 DIAGNOSIS — Y990 Civilian activity done for income or pay: Secondary | ICD-10-CM | POA: Diagnosis not present

## 2023-10-30 DIAGNOSIS — W19XXXA Unspecified fall, initial encounter: Secondary | ICD-10-CM

## 2023-10-30 DIAGNOSIS — S0990XA Unspecified injury of head, initial encounter: Secondary | ICD-10-CM | POA: Diagnosis present

## 2023-10-30 MED ORDER — IBUPROFEN 600 MG PO TABS: 600.0000 mg | ORAL_TABLET | Freq: Four times a day (QID) | ORAL | 0 refills | Status: DC | PRN

## 2023-10-30 NOTE — ED Triage Notes (Signed)
C/o slip and fall yesterday hitting back of head on concrete Denies loc, blood thinner usage, and vision problems.  Hematoma noted.

## 2023-10-30 NOTE — ED Provider Triage Note (Signed)
Emergency Medicine Provider Triage Evaluation Note  TONIYA LECKER , a 62 y.o. female  was evaluated in triage.  Pt complains of Fall.  Review of Systems  Positive: Tinnitus, Head soreness  Negative: LOC, N/V, Loss of bowel or bladder, vision changes  Physical Exam  BP (!) 171/84   Pulse 96   Temp 99.3 F (37.4 C) (Oral)   Resp 18   Wt 69 kg   LMP 08/13/2014   SpO2 100%   BMI 29.66 kg/m  Gen:   Awake, no distress   Resp:  Normal effort  MSK:   Moves extremities without difficulty  Other:    Medical Decision Making  Medically screening exam initiated at 3:54 PM.  Appropriate orders placed.  GAI NEVEU was informed that the remainder of the evaluation will be completed by another provider, this initial triage assessment does not replace that evaluation, and the importance of remaining in the ED until their evaluation is complete.     Dolphus Jenny, PA-C 10/30/23 1556

## 2023-10-30 NOTE — Discharge Instructions (Signed)
Today you were seen after a fall.  You likely have a mild concussion.  Please read the attached instructions and alternate taking Tylenol and Motrin as needed for pain.  Please do not take Motrin for greater than 5 days in a row as this may cause rebound headaches.  Thank you for letting us treat you today. After performing a physical exam, I feel you are safe to go home. Please follow up with your PCP in the next several days and provide them with your records from this visit. Return to the Emergency Room if pain becomes severe or symptoms worsen.

## 2023-10-30 NOTE — ED Provider Notes (Signed)
[facility] EMERGENCY DEPARTMENT AT [facility] Provider Note   CSN: [id number] Arrival date & time: 10/30/23  1349     History  Chief Complaint  Patient presents with   Marletta Lor    Anita Ramirez is a 62 y.o. female presents today after a fall at work.  Patient states she slipped and hit the back of her head.  Patient denies loss of consciousness, nausea, vomiting, loss of bowel or bladder, vision changes, shortness of breath, weakness, numbness, or neck pain.  Patient does endorse some soreness where she hit her head and tinnitus at first which has since resolved.  Patient denies blood thinner use   Fall Associated symptoms include headaches.       Home Medications Prior to Admission medications   Medication Sig Start Date End Date Taking? Authorizing Provider  ibuprofen (ADVIL) 600 MG tablet Take 1 tablet (600 mg total) by mouth every 6 (six) hours as needed. 10/30/23  Yes Dolphus Jenny, PA-C  amLODipine (NORVASC) 5 MG tablet TAKE 1 TABLET(5 MG) BY MOUTH DAILY 05/18/22   Eden Emms, NP  lisinopril-hydrochlorothiazide (ZESTORETIC) 20-25 MG tablet Take 1 tablet by mouth daily. 08/22/23   Eden Emms, NP  simvastatin (ZOCOR) 20 MG tablet Take 1 tablet (20 mg total) by mouth at bedtime. 06/14/23   Eden Emms, NP  Vitamin D, Ergocalciferol, (DRISDOL) 1.25 MG (50000 UNIT) CAPS capsule Take 1 capsule (50,000 Units total) by mouth every 7 (seven) days. 11/17/22   Eden Emms, NP      Allergies    Patient has no known allergies.    Review of Systems   Review of Systems  Neurological:  Positive for headaches.    Physical Exam Updated Vital Signs BP (!) 135/91   Pulse (!) 101   Temp 99.3 F (37.4 C) (Oral)   Resp 18   Wt 69 kg   LMP 08/13/2014   SpO2 100%   BMI 29.66 kg/m  Physical Exam Vitals and nursing note reviewed.  Constitutional:      General: She is not in acute distress.    Appearance: She is well-developed.  HENT:     Head:  Normocephalic and atraumatic.     Comments: Small hematoma to posterior head without laceration or abrasion    Right Ear: External ear normal.     Left Ear: External ear normal.     Nose: Nose normal.  Eyes:     Conjunctiva/sclera: Conjunctivae normal.  Cardiovascular:     Rate and Rhythm: Normal rate and regular rhythm.     Heart sounds: No murmur heard. Pulmonary:     Effort: Pulmonary effort is normal. No respiratory distress.     Breath sounds: Normal breath sounds.  Abdominal:     Palpations: Abdomen is soft.     Tenderness: There is no abdominal tenderness.  Musculoskeletal:        General: No swelling. Normal range of motion.     Cervical back: Normal range of motion and neck supple. No tenderness.  Skin:    General: Skin is warm and dry.     Capillary Refill: Capillary refill takes less than 2 seconds.  Neurological:     General: No focal deficit present.     Mental Status: She is alert.     Motor: No weakness.  Psychiatric:        Mood and Affect: Mood normal.     ED Results / Procedures / Treatments  Labs (all labs ordered are listed, but only abnormal results are displayed) Labs Reviewed - No data to display  EKG None  Radiology No results found.  Procedures Procedures    Medications Ordered in ED Medications - No data to display  ED Course/ Medical Decision Making/ A&P                                 Medical Decision Making Risk Prescription drug management.   This patient presents to the ED with chief complaint(s) of fall with pertinent past medical history of none which further complicates the presenting complaint. The complaint involves an extensive differential diagnosis and also carries with it a high risk of complications and morbidity.    The differential diagnosis includes head injury, brain bleed, C-spine injury  ED Course and Reassessment:   Consultation: - Consulted or discussed management/test interpretation w/ external  professional: None  Consideration for admission or further workup: Considered for admission further workup however patient's mechanism of injury, vital signs, and physical exam is been reassuring.  Patient has no red flag signs or symptoms concerning for brain bleed, TBI, or C-spine injury.  Patient will be treated outpatient with ibuprofen and Tylenol as needed for pain.         Final Clinical Impression(s) / ED Diagnoses Final diagnoses:  Fall, initial encounter    Rx / DC Orders ED Discharge Orders          Ordered    ibuprofen (ADVIL) 600 MG tablet  Every 6 hours PRN        10/30/23 1632              Dolphus Jenny, PA-C 10/30/23 1644    Long, Arlyss Repress, MD 10/30/23 [phone]

## 2023-11-06 ENCOUNTER — Ambulatory Visit: Payer: Self-pay | Admitting: Hematology and Oncology

## 2023-11-06 ENCOUNTER — Ambulatory Visit: Payer: 59

## 2023-11-06 ENCOUNTER — Ambulatory Visit
Admission: RE | Admit: 2023-11-06 | Discharge: 2023-11-06 | Disposition: A | Discharge status: Home / Self Care | Payer: No Typology Code available for payment source | Source: Ambulatory Visit | Attending: Nurse Practitioner | Admitting: Nurse Practitioner

## 2023-11-06 VITALS — BP 129/86 | Wt 151.0 lb

## 2023-11-06 DIAGNOSIS — R928 Other abnormal and inconclusive findings on diagnostic imaging of breast: Secondary | ICD-10-CM

## 2023-11-06 NOTE — Progress Notes (Signed)
Anita Ramirez is a 62 y.o. female who presents to [facility] clinic today with no complaints. Follow up possible distortion in the left breast.   Pap Smear: Pap not smear completed today. Last Pap smear was 11/16/2022 and was normal. Per patient has no history of an abnormal Pap smear. Last Pap smear result is available in Epic.   Physical exam: Breasts Breasts symmetrical. No skin abnormalities bilateral breasts. No nipple retraction bilateral breasts. No nipple discharge bilateral breasts. No lymphadenopathy. No lumps palpated bilateral breasts. MS 3D SCR MAMMO BILAT BR (aka MM) Result Date: 09/25/2023 CLINICAL DATA:  Screening. EXAM: DIGITAL SCREENING BILATERAL MAMMOGRAM WITH TOMOSYNTHESIS AND CAD TECHNIQUE: Bilateral screening digital craniocaudal and mediolateral oblique mammograms were obtained. Bilateral screening digital breast tomosynthesis was performed. The images were evaluated with computer-aided detection. COMPARISON:  Previous exam(s). ACR Breast Density Category c: The breasts are heterogeneously dense, which may obscure small masses. FINDINGS: In the left breast, possible distortion warrants further evaluation. In the right breast, no findings suspicious for malignancy. IMPRESSION: Further evaluation is suggested for possible distortion in the left breast. RECOMMENDATION: Diagnostic mammogram and possibly ultrasound of the left breast. (Code:FI-L-40M) The patient will be contacted regarding the findings, and additional imaging will be scheduled. BI-RADS CATEGORY  0: Incomplete: Need additional imaging evaluation. Electronically Signed   By: Elberta Fortis M.D.   On: 09/25/2023 09:20   MM CLIP PLACEMENT RIGHT Result Date: 11/03/2022 CLINICAL DATA:  Assessed post biopsy marker clip placement following ultrasound-guided core needle biopsy of a right breast mass. EXAM: 3D DIAGNOSTIC RIGHT MAMMOGRAM POST ULTRASOUND BIOPSY COMPARISON:  Previous exam(s). FINDINGS: 3D Mammographic images were  obtained following ultrasound guided biopsy of a right breast mass. The biopsy marking clip is in expected position at the site of biopsy. IMPRESSION: Appropriate positioning of the ribbon shaped biopsy marking clip at the site of biopsy in the within the mass anterior upper right breast. Final Assessment: Post Procedure Mammograms for Marker Placement Electronically Signed   By: Amie Portland M.D.   On: 11/03/2022 08:27  MS DIGITAL SCREENING TOMO BILATERAL Result Date: 09/12/2022 CLINICAL DATA:  Screening. EXAM: DIGITAL SCREENING BILATERAL MAMMOGRAM WITH TOMOSYNTHESIS AND CAD TECHNIQUE: Bilateral screening digital craniocaudal and mediolateral oblique mammograms were obtained. Bilateral screening digital breast tomosynthesis was performed. The images were evaluated with computer-aided detection. COMPARISON:  Previous exam(s). ACR Breast Density Category c: The breast tissue is heterogeneously dense, which may obscure small masses. FINDINGS: In the right breast a mass requires further evaluation. In the left axilla a possible abnormal lymph node requires further evaluation. IMPRESSION: Further evaluation is suggested for mass in the right breast. Further evaluation is suggested for possible abnormal lymph node in the left axilla. RECOMMENDATION: Ultrasound of both breasts. (Code:FI-B-40M) The patient will be contacted regarding the findings, and additional imaging will be scheduled. BI-RADS CATEGORY  0: Incomplete. Need additional imaging evaluation and/or prior mammograms for comparison. Electronically Signed   By: Edwin Cap M.D.   On: 09/12/2022 07:40   MM 3D SCREEN BREAST BILATERAL Result Date: 08/24/2021 CLINICAL DATA:  Screening. EXAM: DIGITAL SCREENING BILATERAL MAMMOGRAM WITH TOMOSYNTHESIS AND CAD TECHNIQUE: Bilateral screening digital craniocaudal and mediolateral oblique mammograms were obtained. Bilateral screening digital breast tomosynthesis was performed. The images were evaluated with  computer-aided detection. COMPARISON:  Previous exam(s). ACR Breast Density Category c: The breast tissue is heterogeneously dense, which may obscure small masses. FINDINGS: There are no findings suspicious for malignancy. IMPRESSION: No mammographic evidence of malignancy. A result letter of  this screening mammogram will be mailed directly to the patient. RECOMMENDATION: Screening mammogram in one year. (Code:SM-B-01Y) BI-RADS CATEGORY  1: Negative. Electronically Signed   By: Frederico Hamman M.D.   On: 08/24/2021 08:00   MM DIGITAL SCREENING BILATERAL Result Date: 08/16/2020 CLINICAL DATA:  Screening. EXAM: DIGITAL SCREENING BILATERAL MAMMOGRAM WITH CAD COMPARISON:  Previous exam(s). ACR Breast Density Category c: The breast tissue is heterogeneously dense, which may obscure small masses. FINDINGS: There are no findings suspicious for malignancy. Images were processed with CAD. IMPRESSION: No mammographic evidence of malignancy. A result letter of this screening mammogram will be mailed directly to the patient. RECOMMENDATION: Screening mammogram in one year. (Code:SM-B-01Y) BI-RADS CATEGORY  1: Negative. Electronically Signed   By: Frederico Hamman M.D.   On: 08/16/2020 14:16   MS DIGITAL SCREENING TOMO BILATERAL Result Date: 06/17/2019 CLINICAL DATA:  Screening. EXAM: DIGITAL SCREENING BILATERAL MAMMOGRAM WITH TOMO AND CAD COMPARISON:  Previous exam(s). ACR Breast Density Category c: The breast tissue is heterogeneously dense, which may obscure small masses. FINDINGS: There are no findings suspicious for malignancy. Images were processed with CAD. IMPRESSION: No mammographic evidence of malignancy. A result letter of this screening mammogram will be mailed directly to the patient. RECOMMENDATION: Screening mammogram in one year. (Code:SM-B-01Y) BI-RADS CATEGORY  1: Negative. Electronically Signed   By: Beckie Salts M.D.   On: 06/17/2019 16:04          Pelvic/Bimanual Pap is not indicated today     Smoking History: Patient has never smoked and was not referred to quit line.    Patient Navigation: Patient education provided. Access to services provided for patient through [facility] program. No interpreter provided. No transportation provided   Colorectal Cancer Screening: Per patient has had colonoscopy completed on 08/15/2023 revealing Surgical - colon; ascending polyp - tubular adenoma; no high-grade dysplasia or malignancy.   No complaints today.    Breast and Cervical Cancer Risk Assessment: Patient has family history of breast cancer, with mother in her 52's. Patient does not have history of cervical dysplasia, immunocompromised, or DES exposure in-utero.  Risk Scores as of Encounter on 11/06/2023     Anita Ramirez           5-year 1.89%   Lifetime 7.95%            Last calculated by Caprice Red, CMA on 11/06/2023 at  8:20 AM        A: [facility] exam without pap smear No complaints with benign exam. Follow up possible left breast distortion.  P: Referred patient to the [facility] for a diagnostic mammogram. Appointment scheduled 11/06/2023.  Ilda Basset A, NP 11/06/2023 8:20 AM

## 2023-11-06 NOTE — Patient Instructions (Signed)
Taught Anita Ramirez about self breast awareness and gave educational materials to take home. Patient did not need a Pap smear today due to last Pap smear was in 11/03/2022 per patient. Let her know [facility] will cover Pap smears every 5 years unless has a history of abnormal Pap smears. Referred patient to the [facility] for diagnostic mammogram. Appointment scheduled for 11/06/2023. Patient aware of appointment and will be there. Let patient know will follow up with her within the next couple weeks with results. Anita Ramirez verbalized understanding.  Pascal Lux, NP 8:32 AM

## 2023-12-18 ENCOUNTER — Ambulatory Visit: Payer: 59

## 2023-12-19 ENCOUNTER — Ambulatory Visit (INDEPENDENT_AMBULATORY_CARE_PROVIDER_SITE_OTHER): Payer: 59

## 2023-12-19 DIAGNOSIS — Z23 Encounter for immunization: Secondary | ICD-10-CM | POA: Diagnosis not present

## 2023-12-19 NOTE — Progress Notes (Signed)
Per orders of Mordecai Maes, NP , injection of shingrix given by Lewanda Rife in right deltoid. Patient tolerated injection well. This was pts second shingrix injection

## 2024-02-13 ENCOUNTER — Other Ambulatory Visit: Payer: Self-pay | Admitting: Nurse Practitioner

## 2024-02-13 DIAGNOSIS — T502X5A Adverse effect of carbonic-anhydrase inhibitors, benzothiadiazides and other diuretics, initial encounter: Secondary | ICD-10-CM

## 2024-03-27 ENCOUNTER — Encounter: Payer: Self-pay | Admitting: Family Medicine

## 2024-03-27 ENCOUNTER — Encounter: Payer: Self-pay | Admitting: *Deleted

## 2024-03-27 ENCOUNTER — Ambulatory Visit: Admitting: Family Medicine

## 2024-03-27 VITALS — BP 153/72 | HR 83 | Temp 98.0°F | Ht 59.5 in | Wt 147.1 lb

## 2024-03-27 DIAGNOSIS — L309 Dermatitis, unspecified: Secondary | ICD-10-CM | POA: Diagnosis not present

## 2024-03-27 DIAGNOSIS — M109 Gout, unspecified: Secondary | ICD-10-CM | POA: Insufficient documentation

## 2024-03-27 DIAGNOSIS — M7989 Other specified soft tissue disorders: Secondary | ICD-10-CM | POA: Diagnosis not present

## 2024-03-27 LAB — URIC ACID: Uric Acid, Serum: 13.7 mg/dL — ABNORMAL HIGH (ref 2.4–7.0)

## 2024-03-27 MED ORDER — PREDNISONE 20 MG PO TABS: ORAL_TABLET | ORAL | 0 refills | Status: DC

## 2024-03-27 NOTE — Progress Notes (Signed)
 Patient ID: Anita Ramirez, female    DOB: July 29, 1961, 63 y.o.   MRN: [id number]  This visit was conducted in person.  BP (!) 153/72   Pulse 83   Temp 98 F (36.7 C) (Temporal)   Ht 4' 11.5" (1.511 m)   Wt 147 lb 2 oz (66.7 kg)   LMP 08/13/2014   SpO2 99%   BMI 29.22 kg/m    CC:  Chief Complaint  Patient presents with   Hand Swelling    Noticed it on Saturday after working in the yard    Subjective:   HPI: JENNINE CICCHINO is a 63 y.o. female patient of Matt cable, NP presenting on 03/27/2024 for Hand Swelling (Noticed it on Saturday after working in the yard)   New onset swelling in right hand  1 day after working in yard. 6 days ago.   No known bite. Area is sore and swollen, decreased range of motion in hand.  Hot on dorsal hand.  Not worse or bette in last few days.  No fall.  No known inury   Feels well otherwise. No N/V, no fever, no flulilke symptoms.  He has tried tylenol  with no help.    No history of diabetes   Does have some gout history... many years ago.  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  Relevant past medical, surgical, family and social history reviewed and updated as indicated. Interim medical history since our last visit reviewed. Allergies and medications reviewed and updated. Outpatient Medications Prior to Visit  Medication Sig Dispense Refill   amLODipine  (NORVASC ) 5 MG tablet TAKE 1 TABLET(5 MG) BY MOUTH DAILY 30 tablet 0   ibuprofen  (ADVIL ) 600 MG tablet Take 1 tablet (600 mg total) by mouth every 6 (six) hours as needed. 30 tablet 0   lisinopril -hydrochlorothiazide  (ZESTORETIC ) 20-25 MG tablet TAKE 1 TABLET BY MOUTH EVERY DAY 90 tablet 1   simvastatin  (ZOCOR ) 20 MG tablet Take 1 tablet (20 mg total) by mouth at bedtime. 90 tablet 3   Vitamin D , Ergocalciferol , (DRISDOL ) 1.25 MG (50000 UNIT) CAPS capsule Take 1 capsule (50,000 Units total) by mouth every 7 (seven) days. 12 capsule 0   No  facility-administered medications prior to visit.     Per HPI unless specifically indicated in ROS section below Review of Systems  Constitutional:  Negative for fatigue and fever.  HENT:  Negative for congestion.   Eyes:  Negative for pain.  Respiratory:  Negative for cough and shortness of breath.   Cardiovascular:  Negative for chest pain, palpitations and leg swelling.  Gastrointestinal:  Negative for abdominal pain.  Genitourinary:  Negative for dysuria and vaginal bleeding.  Musculoskeletal:  Negative for back pain.  Neurological:  Negative for syncope, light-headedness and headaches.  Psychiatric/Behavioral:  Negative for dysphoric mood.    Objective:  BP (!) 153/72   Pulse 83   Temp 98 F (36.7 C) (Temporal)   Ht 4' 11.5" (1.511 m)   Wt 147 lb 2 oz (66.7 kg)   LMP 08/13/2014   SpO2 99%   BMI 29.22 kg/m   Wt Readings from Last 3 Encounters:  03/27/24 147 lb 2 oz (66.7 kg)  11/06/23 151 lb (68.5 kg)  10/30/23 152 lb 1.9 oz (69 kg)      Physical Exam Constitutional:      General: She is not in acute distress.    Appearance: Normal appearance. She is well-developed. She is  not ill-appearing or toxic-appearing.  HENT:     Head: Normocephalic.     Right Ear: Hearing, tympanic membrane, ear canal and external ear normal. Tympanic membrane is not erythematous, retracted or bulging.     Left Ear: Hearing, tympanic membrane, ear canal and external ear normal. Tympanic membrane is not erythematous, retracted or bulging.     Nose: No mucosal edema or rhinorrhea.     Right Sinus: No maxillary sinus tenderness or frontal sinus tenderness.     Left Sinus: No maxillary sinus tenderness or frontal sinus tenderness.     Mouth/Throat:     Pharynx: Uvula midline.  Eyes:     General: Lids are normal. Lids are everted, no foreign bodies appreciated.     Conjunctiva/sclera: Conjunctivae normal.     Pupils: Pupils are equal, round, and reactive to light.  Neck:     Thyroid : No  thyroid  mass or thyromegaly.     Vascular: No carotid bruit.     Trachea: Trachea normal.  Cardiovascular:     Rate and Rhythm: Normal rate and regular rhythm.     Pulses: Normal pulses.     Heart sounds: Normal heart sounds, S1 normal and S2 normal. No murmur heard.    No friction rub. No gallop.  Pulmonary:     Effort: Pulmonary effort is normal. No tachypnea or respiratory distress.     Breath sounds: Normal breath sounds. No decreased breath sounds, wheezing, rhonchi or rales.  Abdominal:     General: Bowel sounds are normal.     Palpations: Abdomen is soft.     Tenderness: There is no abdominal tenderness.  Musculoskeletal:     Cervical back: Normal range of motion and neck supple.  Skin:    General: Skin is warm and dry.     Findings: No rash.  Neurological:     Mental Status: She is alert.  Psychiatric:        Mood and Affect: Mood is not anxious or depressed.        Speech: Speech normal.        Behavior: Behavior normal. Behavior is cooperative.        Thought Content: Thought content normal.        Judgment: Judgment normal.          Results for orders placed or performed in visit on 09/17/23  CBC   Collection Time: 09/17/23  9:07 AM  Result Value Ref Range   WBC 6.2 4.0 - 10.5 K/uL   RBC 3.67 (L) 3.87 - 5.11 Mil/uL   Platelets 228.0 150.0 - 400.0 K/uL   Hemoglobin 10.0 (L) 12.0 - 15.0 g/dL   HCT 60.4 (L) 54.0 - 98.1 %   MCV 88.5 78.0 - 100.0 fl   MCHC 30.9 30.0 - 36.0 g/dL   RDW 19.1 47.8 - 29.5 %  Comprehensive metabolic panel   Collection Time: 09/17/23  9:07 AM  Result Value Ref Range   Sodium 143 135 - 145 mEq/L   Potassium 3.9 3.5 - 5.1 mEq/L   Chloride 106 96 - 112 mEq/L   CO2 29 19 - 32 mEq/L   Glucose, Bld 111 (H) 70 - 99 mg/dL   BUN 33 (H) 6 - 23 mg/dL   Creatinine, Ser 6.21 (H) 0.40 - 1.20 mg/dL   Total Bilirubin 0.4 0.2 - 1.2 mg/dL   Alkaline Phosphatase 50 39 - 117 U/L   AST 14 0 - 37 U/L   ALT 15 0 - 35  U/L   Total Protein 7.3 6.0 -  8.3 g/dL   Albumin 4.5 3.5 - 5.2 g/dL   GFR 46.96 (L) >29.52 mL/min   Calcium 10.0 8.4 - 10.5 mg/dL  Hemoglobin W4X   Collection Time: 09/17/23  9:07 AM  Result Value Ref Range   Hgb A1c MFr Bld 4.7 4.6 - 6.5 %  TSH   Collection Time: 09/17/23  9:07 AM  Result Value Ref Range   TSH 3.26 0.35 - 5.50 uIU/mL  VITAMIN D  25 Hydroxy (Vit-D Deficiency, Fractures)   Collection Time: 09/17/23  9:07 AM  Result Value Ref Range   VITD 44.92 30.00 - 100.00 ng/mL  Lipid panel   Collection Time: 09/17/23  9:07 AM  Result Value Ref Range   Cholesterol 192 0 - 200 mg/dL   Triglycerides 324.4 (H) 0.0 - 149.0 mg/dL   HDL 01.02 >72.53 mg/dL   VLDL 66.4 0.0 - 40.3 mg/dL   LDL Cholesterol 474 (H) 0 - 99 mg/dL   Total CHOL/HDL Ratio 3    NonHDL 131.47     Assessment and Plan  Hand dermatitis Assessment & Plan: Chronic, she has chronically wet hands 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  We discussed trying to keep her hands as dry as possible and possibly changing soap.  She will see how things improve with course of steroids and will start cortisone 10 twice daily to help with skin changes.  We did discuss that if she continues to have her hands wet during the day it will likely recur.   Hand swelling Assessment & Plan:  Acute, sudden onset pain and swelling in right hand.  No known bite or injury.  Concern for possible gout versus allergic reaction to bite.  Less likely cellulitis. Will evaluate with uric acid and treat with prednisone  taper.  Encouraged ice and elevation.  She will call if she has any fever or redness/swelling spreading up her arm.  Return and ER precautions provided.  Orders: -     Uric acid  Other orders -     predniSONE ; 3 tabs by mouth daily x 3 days, then 2 tabs by mouth daily x 2 days then 1 tab by mouth daily x 2 days  Dispense: 15 tablet; Refill: 0    No follow-ups on file.   Herby Lolling, MD

## 2024-03-27 NOTE — Assessment & Plan Note (Signed)
 Acute, sudden onset pain and swelling in right hand.  No known bite or injury.  Concern for possible gout versus allergic reaction to bite.  Less likely cellulitis. Will evaluate with uric acid and treat with prednisone  taper.  Encouraged ice and elevation.  She will call if she has any fever or redness/swelling spreading up her arm.  Return and ER precautions provided.

## 2024-03-27 NOTE — Assessment & Plan Note (Signed)
 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  We discussed trying to keep her hands as dry as possible and possibly changing soap.  She will see how things improve with course of steroids and will start cortisone 10 twice daily to help with skin changes.  We did discuss that if she continues to have her hands wet during the day it will likely recur.

## 2024-05-09 ENCOUNTER — Ambulatory Visit (HOSPITAL_COMMUNITY)
Admission: EM | Admit: 2024-05-09 | Discharge: 2024-05-09 | Disposition: A | Discharge status: Home / Self Care | Attending: Family Medicine | Admitting: Family Medicine

## 2024-05-09 ENCOUNTER — Encounter (HOSPITAL_COMMUNITY): Payer: Self-pay

## 2024-05-09 DIAGNOSIS — M545 Low back pain, unspecified: Secondary | ICD-10-CM

## 2024-05-09 MED ORDER — DEXAMETHASONE SODIUM PHOSPHATE 10 MG/ML IJ SOLN
INTRAMUSCULAR | Status: AC
Start: 2024-05-09 — End: 2024-05-09
  Filled 2024-05-09: qty 1

## 2024-05-09 MED ORDER — DEXAMETHASONE SODIUM PHOSPHATE 10 MG/ML IJ SOLN
10.0000 mg | Freq: Once | INTRAMUSCULAR | Status: AC
  Administered 2024-05-09: 10 mg via INTRAMUSCULAR

## 2024-05-09 MED ORDER — TIZANIDINE HCL 4 MG PO TABS: 2.0000 mg | ORAL_TABLET | Freq: Three times a day (TID) | ORAL | 0 refills | Status: DC | PRN

## 2024-05-09 MED ORDER — PREDNISONE 20 MG PO TABS: 40.0000 mg | ORAL_TABLET | Freq: Every day | ORAL | 0 refills | Status: AC

## 2024-05-09 NOTE — ED Triage Notes (Signed)
 Patient here today with c/o right side low back pain that radiates around the sides since Wednesday. Pain is worsening. She tried taking Tylenol  with no relief.

## 2024-05-09 NOTE — Discharge Instructions (Signed)
 You have been given a shot of dexamethasone 10 mg, steroid.  Take prednisone  20 mg--2 daily for 5 days   Take tizanidine 4 mg--1/2-1 tablet every 8 hours as needed for muscle spasms; this medication can cause dizziness and sleepiness  You can try a heating pad or ice to the sore area to see if it helps.  Please follow-up with your primary care about this issue

## 2024-05-09 NOTE — ED Provider Notes (Signed)
 [facility]    CSN: [id number] Arrival date & time: 05/09/24  1107      History   Chief Complaint Chief Complaint  Patient presents with   Back Pain    HPI Anita Ramirez is a 63 y.o. female.    Back Pain Here for right low back pain that began bothering her on June 18.  No trauma or fall.  No fever or dysuria or hematuria or rash.  No nausea or vomiting or upper respiratory symptoms.  NKDA  Past medical history significant for mild CKD with her most recent EGFR being in the 40s.  Tylenol  has not helped his pain.  No new bowel or bladder incontinence  Past Medical History:  Diagnosis Date   Anemia    History of chicken pox    Hyperlipemia    Hypertension    Obesity     Patient Active Problem List   Diagnosis Date Noted   Hand dermatitis 03/27/2024   Hand swelling 03/27/2024   Hx of adenomatous polyp of colon 08/21/2023   Preventative health care 11/16/2022   Screening for cervical cancer 11/16/2022   Diuretic-induced hypokalemia 08/25/2021   Vitamin D  deficiency 08/25/2021   Obesity (BMI 30-39.9) 11/05/2014   Hyperlipemia 11/06/2013   Primary hypertension 05/08/2013    Past Surgical History:  Procedure Laterality Date   BREAST BIOPSY Right 11/03/2022   US  RT BREAST BX W LOC DEV 1ST LESION IMG BX SPEC US  GUIDE 11/03/2022 [facility]   COLONOSCOPY     no prior surgery      OB History     Gravida  0   Para  0   Term  0   Preterm  0   AB  0   Living  0      SAB  0   IAB  0   Ectopic  0   Multiple  0   Live Births  0            Home Medications    Prior to Admission medications   Medication Sig Start Date End Date Taking? Authorizing Provider  amLODipine  (NORVASC ) 5 MG tablet TAKE 1 TABLET(5 MG) BY MOUTH DAILY 05/18/22   Dorothe Gaster, NP  lisinopril -hydrochlorothiazide  (ZESTORETIC ) 20-25 MG tablet TAKE 1 TABLET BY MOUTH EVERY DAY 02/13/24   Dorothe Gaster, NP  predniSONE  (DELTASONE ) 20 MG tablet Take  2 tablets (40 mg total) by mouth daily with breakfast for 5 days. 05/09/24 05/14/24 Yes Ann Keto, MD  simvastatin  (ZOCOR ) 20 MG tablet Take 1 tablet (20 mg total) by mouth at bedtime. 06/14/23   Dorothe Gaster, NP  tiZANidine (ZANAFLEX) 4 MG tablet Take 0.5-1 tablets (2-4 mg total) by mouth every 8 (eight) hours as needed for muscle spasms. 05/09/24  Yes Marl Seago, Paige Boatman, MD  Vitamin D , Ergocalciferol , (DRISDOL ) 1.25 MG (50000 UNIT) CAPS capsule Take 1 capsule (50,000 Units total) by mouth every 7 (seven) days. 11/17/22   Dorothe Gaster, NP    Family History Family History  Problem Relation Age of Onset   Breast cancer Mother 38 - 39   Hypertension Mother    Pancreatic cancer Maternal Uncle    Cancer Maternal Grandmother 54       breast   Prostate cancer Maternal Grandfather    Breast cancer Other    Early death Neg Hx    Stroke Neg Hx    Colon cancer Neg Hx    Colon polyps Neg  Hx    Esophageal cancer Neg Hx    Rectal cancer Neg Hx    Stomach cancer Neg Hx     Social History Social History   Tobacco Use   Smoking status: Never   Smokeless tobacco: Never  Vaping Use   Vaping status: Never Used  Substance Use Topics   Alcohol use: No   Drug use: No     Allergies   Patient has no known allergies.   Review of Systems Review of Systems  Musculoskeletal:  Positive for back pain.     Physical Exam Triage Vital Signs ED Triage Vitals [05/09/24 1128]  Encounter Vitals Group     BP (!) 165/82     Girls Systolic BP Percentile      Girls Diastolic BP Percentile      Boys Systolic BP Percentile      Boys Diastolic BP Percentile      Pulse Rate 83     Resp 16     Temp 99.1 F (37.3 C)     Temp Source Oral     SpO2 98 %     Weight      Height      Head Circumference      Peak Flow      Pain Score 10     Pain Loc      Pain Education      Exclude from Growth Chart    No data found.  Updated Vital Signs BP (!) 165/82 (BP Location: Left Arm)   Pulse  83   Temp 99.1 F (37.3 C) (Oral)   Resp 16   LMP 08/13/2014   SpO2 98%   Visual Acuity Right Eye Distance:   Left Eye Distance:   Bilateral Distance:    Right Eye Near:   Left Eye Near:    Bilateral Near:     Physical Exam Vitals reviewed.  Constitutional:      Appearance: She is not ill-appearing, toxic-appearing or diaphoretic.     Comments: No acute respiratory distress, but she is in some obvious discomfort, sitting in a wheelchair in the clinic room.  HENT:     Mouth/Throat:     Mouth: Mucous membranes are moist.   Eyes:     Extraocular Movements: Extraocular movements intact.     Conjunctiva/sclera: Conjunctivae normal.     Pupils: Pupils are equal, round, and reactive to light.    Cardiovascular:     Rate and Rhythm: Normal rate and regular rhythm.     Heart sounds: No murmur heard. Pulmonary:     Effort: Pulmonary effort is normal.     Breath sounds: Normal breath sounds.  Abdominal:     Palpations: Abdomen is soft.     Tenderness: There is no abdominal tenderness.   Musculoskeletal:     Cervical back: Neck supple.     Comments: The right low back is nontender.  Straight leg raise causes low back pain to increase but does not cause pain down her leg.  There is no rash or deformity.  Lymphadenopathy:     Cervical: No cervical adenopathy.   Skin:    Coloration: Skin is not jaundiced or pale.   Neurological:     General: No focal deficit present.     Mental Status: She is alert and oriented to person, place, and time.   Psychiatric:        Behavior: Behavior normal.      UC Treatments / Results  Labs (all labs ordered are listed, but only abnormal results are displayed) Labs Reviewed - No data to display  EKG   Radiology No results found.  Procedures Procedures (including critical care time)  Medications Ordered in UC Medications  dexamethasone (DECADRON) injection 10 mg (has no administration in time range)    Initial Impression /  Assessment and Plan / UC Course  I have reviewed the triage vital signs and the nursing notes.  Pertinent labs & imaging results that were available during my care of the patient were reviewed by me and considered in my medical decision making (see chart for details).     With her reduced renal function, I am not going to use any NSAIDs.  Decadron injections given here and 5 days of prednisone  are sent in along with tizanidine for muscle relaxer.  I have asked her to follow-up with her primary care and a work note is provided. Final Clinical Impressions(s) / UC Diagnoses   Final diagnoses:  Acute right-sided low back pain without sciatica     Discharge Instructions      You have been given a shot of dexamethasone 10 mg, steroid.  Take prednisone  20 mg--2 daily for 5 days   Take tizanidine 4 mg--1/2-1 tablet every 8 hours as needed for muscle spasms; this medication can cause dizziness and sleepiness  You can try a heating pad or ice to the sore area to see if it helps.  Please follow-up with your primary care about this issue      ED Prescriptions     Medication Sig Dispense Auth. Provider   predniSONE  (DELTASONE ) 20 MG tablet Take 2 tablets (40 mg total) by mouth daily with breakfast for 5 days. 10 tablet Azuri Bozard K, MD   tiZANidine (ZANAFLEX) 4 MG tablet Take 0.5-1 tablets (2-4 mg total) by mouth every 8 (eight) hours as needed for muscle spasms. 10 tablet Ellsworth Haas Paige Boatman, MD      I have reviewed the PDMP during this encounter.   Ann Keto, MD 05/09/24 1153

## 2024-05-20 ENCOUNTER — Encounter: Payer: Self-pay | Admitting: Internal Medicine

## 2024-05-20 ENCOUNTER — Ambulatory Visit: Admitting: Internal Medicine

## 2024-05-20 VITALS — BP 136/84 | HR 82 | Temp 98.2°F | Ht 59.5 in | Wt 149.0 lb

## 2024-05-20 DIAGNOSIS — S39012D Strain of muscle, fascia and tendon of lower back, subsequent encounter: Secondary | ICD-10-CM | POA: Diagnosis not present

## 2024-05-20 MED ORDER — TIZANIDINE HCL 4 MG PO TABS: 2.0000 mg | ORAL_TABLET | Freq: Three times a day (TID) | ORAL | 0 refills | Status: AC | PRN

## 2024-05-20 NOTE — Assessment & Plan Note (Signed)
 Is better Will refill the tizanidine  for prn use Should use tylenol  regularly Can use heat prn Must move feet and turn body with lifting rather than twisting

## 2024-05-20 NOTE — Patient Instructions (Signed)
 Please take tylenol  regularly till your back feels better. Use the muscle relaxer mostly at bedtime Make sure you turn your body entirely instead of twisting when moving things at work

## 2024-05-20 NOTE — Progress Notes (Signed)
 Subjective:    Patient ID: Anita Ramirez, female    DOB: 12-13-1960, 63 y.o.   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 Muscle spasms just above right posterior hip--trouble walking Very painful Went to urgent care that first day Diagnosed with muscle injury and given tizanidine ---and got the time off Went back to work a week ago---worked the whole day and each day since Was told at urgent care to have follow up The pain is better--able to work Taking the tizanidine  8mg  in AM and 4mg  at bedtime Taking tylenol  at times Got prednisone  but only took for 2-3 days  Bison onto back at work a month ago-but doesn't remember injury from this  Pain doesn't radiate to legs or upper back Current Outpatient Medications on File Prior to Visit  Medication Sig Dispense Refill   amLODipine  (NORVASC ) 5 MG tablet TAKE 1 TABLET(5 MG) BY MOUTH DAILY 30 tablet 0   lisinopril -hydrochlorothiazide  (ZESTORETIC ) 20-25 MG tablet TAKE 1 TABLET BY MOUTH EVERY DAY 90 tablet 1   simvastatin  (ZOCOR ) 20 MG tablet Take 1 tablet (20 mg total) by mouth at bedtime. 90 tablet 3   tiZANidine  (ZANAFLEX ) 4 MG tablet Take 0.5-1 tablets (2-4 mg total) by mouth every 8 (eight) hours as needed for muscle spasms. 10 tablet 0   Vitamin D , Ergocalciferol , (DRISDOL ) 1.25 MG (50000 UNIT) CAPS capsule Take 1 capsule (50,000 Units total) by mouth every 7 (seven) days. 12 capsule 0   No current facility-administered medications on file prior to visit.    No Known Allergies  Past Medical History:  Diagnosis Date   Anemia    History of chicken pox    Hyperlipemia    Hypertension    Obesity     Past Surgical History:  Procedure Laterality Date   BREAST BIOPSY Right 11/03/2022   US  RT BREAST BX W LOC DEV 1ST LESION IMG BX SPEC US  GUIDE 11/03/2022 [facility]   COLONOSCOPY     no prior surgery       Family History  Problem Relation Age of Onset   Breast cancer Mother 60 - 36   Hypertension Mother    Pancreatic cancer Maternal Uncle    Cancer Maternal Grandmother 52       breast   Prostate cancer Maternal Grandfather    Breast cancer Other    Early death Neg Hx    Stroke Neg Hx    Colon cancer Neg Hx    Colon polyps Neg Hx    Esophageal cancer Neg Hx    Rectal cancer Neg Hx    Stomach cancer Neg Hx     Social History   Socioeconomic History   Marital status: Single    Spouse name: Not on file   Number of children: 0   Years of education: 12   Highest education level: Not on file  Occupational History   Not on file  Tobacco Use   Smoking status: Never   Smokeless tobacco: Never  Vaping Use   Vaping status: Never Used  Substance and Sexual Activity   Alcohol use: No   Drug use: No   Sexual activity: Not Currently    Birth control/protection: Post-menopausal  Other Topics Concern   Not on file  Social History Narrative   Regular exercise-yes   Caffeine Use-no   Social Drivers of Health  Financial Resource Strain: Not on file  Food Insecurity: No Food Insecurity (11/06/2023)   Hunger Vital Sign    Worried About Running Out of Food in the Last Year: Never true    Ran Out of Food in the Last Year: Never true  Transportation Needs: No Transportation Needs (11/06/2023)   PRAPARE - Administrator, Civil Service (Medical): No    Lack of Transportation (Non-Medical): No  Physical Activity: Not on file  Stress: Not on file  Social Connections: Not on file  Intimate Partner Violence: Not on file   Review of Systems Able to sleep No loss of urine/stool control No perineal numbness     Objective:   Physical Exam Constitutional:      Appearance: Normal appearance.   Musculoskeletal:     Comments: Mild tenderness along upper lumbar flank below CVA No spine tenderness Spine flexion to 80 degrees SLR negative Normal ROM in hips    Neurological:     Mental Status: She is alert.     Comments: Normal gait and leg strength           Assessment & Plan:

## 2024-08-14 ENCOUNTER — Other Ambulatory Visit: Payer: Self-pay | Admitting: Nurse Practitioner

## 2024-08-14 DIAGNOSIS — E876 Hypokalemia: Secondary | ICD-10-CM

## 2024-08-14 NOTE — Telephone Encounter (Signed)
 Needs to be scheduled a CPE after 09/17/2024 or within 30 days past that date

## 2024-08-14 NOTE — Telephone Encounter (Signed)
 Lvm to schedule no Mychart

## 2024-08-15 NOTE — Telephone Encounter (Unsigned)
 Copied from CRM #[id number]. Topic: Appointments - Appointment Scheduling >> Aug 14, 2024  3:35 PM Anita Ramirez wrote: Patient/patient representative is calling to schedule an appointment. Refer to attachments for appointment information.  Patient is returning a call he received from the office to schedule her CPE; however, [name] stated to schedule after 09/17/2024 or within 30 days and he does not have any openings.

## 2024-08-15 NOTE — Telephone Encounter (Signed)
 LVM to schedule office visit in next 30 days

## 2024-08-20 ENCOUNTER — Telehealth: Payer: Self-pay

## 2024-08-20 NOTE — Telephone Encounter (Signed)
 Telephoned patient at mobile number. Patient requested a mammogram scholarship application. Confirmed patient's address. [facility]

## 2024-09-04 ENCOUNTER — Emergency Department (HOSPITAL_COMMUNITY)

## 2024-09-04 ENCOUNTER — Ambulatory Visit: Payer: Self-pay

## 2024-09-04 ENCOUNTER — Emergency Department (HOSPITAL_COMMUNITY)
Admission: EM | Admit: 2024-09-04 | Discharge: 2024-09-04 | Disposition: A | Discharge status: Home / Self Care | Attending: Emergency Medicine | Admitting: Emergency Medicine

## 2024-09-04 ENCOUNTER — Other Ambulatory Visit: Payer: Self-pay

## 2024-09-04 DIAGNOSIS — M545 Low back pain, unspecified: Secondary | ICD-10-CM | POA: Insufficient documentation

## 2024-09-04 LAB — URINALYSIS, ROUTINE W REFLEX MICROSCOPIC
Bilirubin Urine: NEGATIVE
Glucose, UA: NEGATIVE mg/dL
Hgb urine dipstick: NEGATIVE
Ketones, ur: NEGATIVE mg/dL
Nitrite: NEGATIVE
Protein, ur: NEGATIVE mg/dL
Specific Gravity, Urine: 1.019 (ref 1.005–1.030)
pH: 5 (ref 5.0–8.0)

## 2024-09-04 MED ORDER — TRAMADOL HCL 50 MG PO TABS: 50.0000 mg | ORAL_TABLET | Freq: Four times a day (QID) | ORAL | 0 refills | Status: AC | PRN

## 2024-09-04 NOTE — Telephone Encounter (Signed)
 FYI Only or Action Required?: FYI only for provider.  Patient was last seen in primary care on 05/20/2024 by Jimmy Charlie FERNS, MD.  Called Nurse Triage reporting Back Pain.  Symptoms began several days ago.  Interventions attempted: OTC medications: tylenol , ibuprofen .  Symptoms are: unchanged.  Triage Disposition: See PCP When Office is Open (Within 3 Days)  Patient/caregiver understands and will follow disposition?: Yes  Copied from CRM [id number]. Topic: Clinical - Red Word Triage >> Sep 04, 2024 10:32 AM Jasmin G wrote: Red Word that prompted transfer to Nurse Triage: Pt initially called to make an appt due to experiencing the left side of her back tingling and being really sore. Reason for Disposition  [1] MODERATE back pain (e.g., interferes with normal activities) AND [2] present > 3 days  Answer Assessment - Initial Assessment Questions 1. ONSET: When did the pain begin? (e.g., minutes, hours, days)     Four days ago 2. LOCATION: Where does it hurt? (upper, mid or lower back)     Left lower back 3. SEVERITY: How bad is the pain?  (e.g., Scale 1-10; mild, moderate, or severe)     8/10 when sitting, less painful when standing 4. PATTERN: Is the pain constant? (e.g., yes, no; constant, intermittent)      Comes and goes 5. RADIATION: Does the pain shoot into your legs or somewhere else?     denies 6. CAUSE:  What do you think is causing the back pain?      unsure 7. BACK OVERUSE:  Any recent lifting of heavy objects, strenuous work or exercise?     denies 8. MEDICINES: What have you taken so far for the pain? (e.g., nothing, acetaminophen , NSAIDS)     Extra strength tylenol  and advil , but not helpful 9. NEUROLOGIC SYMPTOMS: Do you have any weakness, numbness, or problems with bowel/bladder control?     denies 10. OTHER SYMPTOMS: Do you have any other symptoms? (e.g., fever, abdomen pain, burning with urination, blood in urine)       denies 11.  PREGNANCY: Is there any chance you are pregnant? When was your last menstrual period?       N/a  Protocols used: Back Pain-A-AH

## 2024-09-04 NOTE — Telephone Encounter (Signed)
 Will see patient then Agree with ER and UC precautions

## 2024-09-04 NOTE — Telephone Encounter (Signed)
Noted. Appreciate Dr. Marliss Coots evaluation.

## 2024-09-04 NOTE — Discharge Instructions (Signed)
 As we discussed, use Tramadol for pain every 8 hours as needed. Follow up with your doctor for recheck in 1 week. Return to the ED with any new or worsening symptoms at any time.

## 2024-09-04 NOTE — ED Triage Notes (Signed)
 Pt ambulatory to triage with complaints of non-radiating RIGHT sided lower back pain that has worsened. Pt endorses increased urinary frequency. Denies dysuria, denies obvious hematuria.   Pt appears to be in no distress.

## 2024-09-04 NOTE — ED Provider Notes (Signed)
 [facility] Provider Note   CSN: [id number] Arrival date & time: 09/04/24  1147     Patient presents with: Back Pain and Urinary Frequency   Anita Ramirez is a 63 y.o. female.   Patient to ED with mother at bedside. She reports right low back pain for the past 4 days. This pain is recurrent and, per mom, she seems to have episodes about once a month. She states the pain comes to the RLQ abdomen infrequently, usually with certain positions. No flank pain. She endorses urinary frequency but no dysuria or hematuria. No fever, nausea, vomiting.   The history is provided by the patient and a parent. No language interpreter was used.  Back Pain Urinary Frequency       Prior to Admission medications   Medication Sig Start Date End Date Taking? Authorizing Provider  traMADol (ULTRAM) 50 MG tablet Take 1 tablet (50 mg total) by mouth every 6 (six) hours as needed. 09/04/24  Yes Gaylen Pereira, Margit, PA-C  amLODipine  (NORVASC ) 5 MG tablet TAKE 1 TABLET(5 MG) BY MOUTH DAILY 05/18/22   Wendee Lynwood HERO, NP  lisinopril -hydrochlorothiazide  (ZESTORETIC ) 20-25 MG tablet TAKE 1 TABLET BY MOUTH EVERY DAY 08/14/24   Wendee Lynwood HERO, NP  simvastatin  (ZOCOR ) 20 MG tablet Take 1 tablet (20 mg total) by mouth at bedtime. 06/14/23   Wendee Lynwood HERO, NP  tiZANidine  (ZANAFLEX ) 4 MG tablet Take 0.5-1 tablets (2-4 mg total) by mouth every 8 (eight) hours as needed for muscle spasms. 05/20/24   Jimmy Charlie FERNS, MD  Vitamin D , Ergocalciferol , (DRISDOL ) 1.25 MG (50000 UNIT) CAPS capsule Take 1 capsule (50,000 Units total) by mouth every 7 (seven) days. 11/17/22   Wendee Lynwood HERO, NP    Allergies: Patient has no known allergies.    Review of Systems  Genitourinary:  Positive for frequency.  Musculoskeletal:  Positive for back pain.    Updated Vital Signs BP (!) 178/96 (BP Location: Left Arm)   Pulse (!) 108   Temp 98 F (36.7 C) (Oral)   Resp 18   LMP 08/13/2014    SpO2 100%   Physical Exam Constitutional:      General: She is not in acute distress.    Appearance: She is well-developed. She is not ill-appearing.  Pulmonary:     Effort: Pulmonary effort is normal.  Abdominal:     General: There is no distension.     Palpations: Abdomen is soft.     Tenderness: There is no abdominal tenderness.  Musculoskeletal:        General: Normal range of motion.     Cervical back: Normal range of motion.       Back:     Comments: Lidocaine patch in place over area of discomfort. Not reproducible to palpation.  Skin:    General: Skin is warm and dry.  Neurological:     Mental Status: She is alert and oriented to person, place, and time.     (all labs ordered are listed, but only abnormal results are displayed) Labs Reviewed  URINALYSIS, ROUTINE W REFLEX MICROSCOPIC - Abnormal; Notable for the following components:      Result Value   APPearance CLOUDY (*)    Leukocytes,Ua TRACE (*)    Bacteria, UA FEW (*)    All other components within normal limits    EKG: None  Radiology: DG Lumbar Spine Complete Result Date: 09/04/2024 EXAM: 4 VIEW(S) XRAY OF THE LUMBAR SPINE 09/04/2024  12:43:00 PM COMPARISON: None available. CLINICAL HISTORY: low back pain. Pt ambulatory to triage with complaints of non-radiating RIGHT sided lower back pain that has worsened. Pt endorses increased urinary frequency. Denies dysuria, denies obvious hematuria. FINDINGS: LUMBAR SPINE: BONES: No acute fracture. No aggressive appearing osseous lesion. Alignment is normal. DISCS AND DEGENERATIVE CHANGES: Moderate degenerative disc disease is noted at L3-4. Mild degenerative disc disease is noted at L2-3. SOFT TISSUES: No acute abnormality. IMPRESSION: 1. Mild degenerative disc disease at L2-3. 2. Moderate degenerative disc disease at L3-4. Electronically signed by: Lynwood Seip MD 09/04/2024 01:30 PM EDT RP Workstation: [id number]     Procedures   Medications Ordered in the ED -  No data to display  Clinical Course as of 09/04/24 1408  Thu Sep 04, 2024  1404 Lumbar film and UA both negative in evaluation of low right back pain. No neurologic red flags. Symptoms recurrent. No flank pain. No fever.   She is stable for discharge home. Encouraged recheck with her doctor. Will provide #10 Tramadol.  [SU]    Clinical Course User Index [SU] Odell Balls, PA-C                                 Medical Decision Making Amount and/or Complexity of Data Reviewed Labs: ordered. Radiology: ordered.        Final diagnoses:  Acute right-sided low back pain without sciatica    ED Discharge Orders          Ordered    traMADol (ULTRAM) 50 MG tablet  Every 6 hours PRN        09/04/24 1407               Odell Balls, PA-C 09/04/24 1408    Elnor Jayson LABOR, DO 09/05/24 [phone]

## 2024-09-05 ENCOUNTER — Ambulatory Visit: Admitting: Family Medicine

## 2024-09-09 ENCOUNTER — Telehealth: Payer: Self-pay

## 2024-09-09 NOTE — Telephone Encounter (Signed)
 Telephoned patient at mobile number. Left a voice message with [employer] (scholarship) contact information.

## 2024-10-14 ENCOUNTER — Telehealth: Payer: Self-pay

## 2024-10-22 ENCOUNTER — Other Ambulatory Visit: Payer: Self-pay | Admitting: Nurse Practitioner

## 2024-10-22 DIAGNOSIS — Z1231 Encounter for screening mammogram for malignant neoplasm of breast: Secondary | ICD-10-CM

## 2024-10-31 ENCOUNTER — Ambulatory Visit (HOSPITAL_COMMUNITY)
Admission: EM | Admit: 2024-10-31 | Discharge: 2024-10-31 | Disposition: A | Discharge status: Home / Self Care | Attending: Family Medicine | Admitting: Family Medicine

## 2024-10-31 ENCOUNTER — Encounter (HOSPITAL_COMMUNITY): Payer: Self-pay | Admitting: Emergency Medicine

## 2024-10-31 DIAGNOSIS — M7989 Other specified soft tissue disorders: Secondary | ICD-10-CM

## 2024-10-31 MED ORDER — PREDNISONE 10 MG (21) PO TBPK: ORAL_TABLET | Freq: Every day | ORAL | 0 refills | Status: DC

## 2024-10-31 NOTE — Discharge Instructions (Addendum)
 Likely acute gout flare. Your blood pressure is elevated today.  You have been prescribed prednisone  taper pack.  Please take as directed.  You can take Tylenol /acetaminophen  and/or Advil /ibuprofen  for pain. Apply ice to the area.  On for 15-20 minutes 2-3 times daily.  If you develop any new or worsening symptoms or if your symptoms do not start to improve, please follow-up here or with your primary care physician.  If your symptoms are severe, please go to the emergency room.

## 2024-10-31 NOTE — ED Triage Notes (Signed)
 Pt c/o left hand pain and swelling since Tuesday. Pt denies any injury. Tried extra strength Tylenol .

## 2024-10-31 NOTE — ED Provider Notes (Signed)
 [facility]    CSN: [id number] Arrival date & time: 10/31/24  9041      History   Chief Complaint Chief Complaint  Patient presents with   hand swelling    HPI Anita Ramirez is a 63 y.o. female.   This 63 year old female is being seen for acute onset left hand and wrist swelling.  She reports she woke up Tuesday morning with significant left hand and wrist edema.  She has been taking Tylenol  without relief of symptoms.  She says the appearance of her left hand and wrist has remained the same since Tuesday, no improvement and no worsening.  She denies known injury or trauma.  No known insect or animal bite.  She reports history of same several months ago and was diagnosed with gout.  She was treated with steroid taper at that time which she reports was effective in improving symptoms.  Left hand is warm, not excessively hot.  There is no erythema streaking up her arm.  She denies fever, chills.  She denies headache, blurry vision.  She denies chest pain, shortness of breath.         Past Medical History:  Diagnosis Date   Anemia    History of chicken pox    Hyperlipemia    Hypertension    Obesity     Patient Active Problem List   Diagnosis Date Noted   Lumbar strain, subsequent encounter 05/20/2024   Hand dermatitis 03/27/2024   Hand swelling 03/27/2024   Hx of adenomatous polyp of colon 08/21/2023   Preventative health care 11/16/2022   Screening for cervical cancer 11/16/2022   Diuretic-induced hypokalemia 08/25/2021   Vitamin D  deficiency 08/25/2021   Obesity (BMI 30-39.9) 11/05/2014   Hyperlipemia 11/06/2013   Primary hypertension 05/08/2013    Past Surgical History:  Procedure Laterality Date   BREAST BIOPSY Right 11/03/2022   US  RT BREAST BX W LOC DEV 1ST LESION IMG BX SPEC US  GUIDE 11/03/2022 [facility]   COLONOSCOPY     no prior surgery      OB History     Gravida  0   Para  0   Term  0   Preterm  0   AB  0   Living   0      SAB  0   IAB  0   Ectopic  0   Multiple  0   Live Births  0            Home Medications    Prior to Admission medications  Medication Sig Start Date End Date Taking? Authorizing Provider  predniSONE  (STERAPRED UNI-PAK 21 TAB) 10 MG (21) TBPK tablet Take by mouth daily. Take 4 tabs by mouth daily  for 2 days, then 3 tabs for 2 days, then 2 tabs for 2 days, then 1 tab for 2 days, 0.5 tab for 2 days. 10/31/24  Yes Aneisa Karren C, FNP  amLODipine  (NORVASC ) 5 MG tablet TAKE 1 TABLET(5 MG) BY MOUTH DAILY 05/18/22   Wendee Lynwood HERO, NP  lisinopril -hydrochlorothiazide  (ZESTORETIC ) 20-25 MG tablet TAKE 1 TABLET BY MOUTH EVERY DAY 08/14/24   Wendee Lynwood HERO, NP  simvastatin  (ZOCOR ) 20 MG tablet Take 1 tablet (20 mg total) by mouth at bedtime. 06/14/23   Wendee Lynwood HERO, NP  tiZANidine  (ZANAFLEX ) 4 MG tablet Take 0.5-1 tablets (2-4 mg total) by mouth every 8 (eight) hours as needed for muscle spasms. 05/20/24   Jimmy Charlie FERNS, MD  traMADol  (  ULTRAM ) 50 MG tablet Take 1 tablet (50 mg total) by mouth every 6 (six) hours as needed. 09/04/24   Odell Balls, PA-C  Vitamin D , Ergocalciferol , (DRISDOL ) 1.25 MG (50000 UNIT) CAPS capsule Take 1 capsule (50,000 Units total) by mouth every 7 (seven) days. 11/17/22   Wendee Lynwood HERO, NP    Family History Family History  Problem Relation Age of Onset   Breast cancer Mother 67 - 65   Hypertension Mother    Pancreatic cancer Maternal Uncle    Cancer Maternal Grandmother 87       breast   Prostate cancer Maternal Grandfather    Breast cancer Other    Early death Neg Hx    Stroke Neg Hx    Colon cancer Neg Hx    Colon polyps Neg Hx    Esophageal cancer Neg Hx    Rectal cancer Neg Hx    Stomach cancer Neg Hx     Social History Social History[1]   Allergies   Patient has no known allergies.   Review of Systems Review of Systems  Constitutional:  Positive for activity change. Negative for chills and fever.  Respiratory:  Negative  for shortness of breath.   Cardiovascular:  Negative for chest pain.  Gastrointestinal:  Negative for abdominal pain, nausea and vomiting.  Musculoskeletal:  Positive for arthralgias and joint swelling.  Skin:  Negative for color change and rash.  Neurological:  Negative for dizziness and headaches.  All other systems reviewed and are negative.    Physical Exam Triage Vital Signs ED Triage Vitals  Encounter Vitals Group     BP 10/31/24 1051 (!) 162/72     Girls Systolic BP Percentile --      Girls Diastolic BP Percentile --      Boys Systolic BP Percentile --      Boys Diastolic BP Percentile --      Pulse Rate 10/31/24 1051 90     Resp 10/31/24 1051 18     Temp 10/31/24 1051 98.6 F (37 C)     Temp Source 10/31/24 1051 Oral     SpO2 10/31/24 1051 97 %     Weight --      Height --      Head Circumference --      Peak Flow --      Pain Score 10/31/24 1050 10     Pain Loc --      Pain Education --      Exclude from Growth Chart --    No data found.  Updated Vital Signs BP (!) 162/72 (BP Location: Right Arm)   Pulse 90   Temp 98.6 F (37 C) (Oral)   Resp 18   LMP 08/13/2014   SpO2 97%   Visual Acuity Right Eye Distance:   Left Eye Distance:   Bilateral Distance:    Right Eye Near:   Left Eye Near:    Bilateral Near:     Physical Exam Vitals and nursing note reviewed.  Constitutional:      General: She is not in acute distress.    Appearance: She is well-developed. She is not ill-appearing or toxic-appearing.     Comments: Pleasant female appearing stated age found sitting in chair in no acute distress.  HENT:     Head: Normocephalic and atraumatic.  Eyes:     Conjunctiva/sclera: Conjunctivae normal.  Cardiovascular:     Rate and Rhythm: Normal rate and regular rhythm.     Heart  sounds: Normal heart sounds. No murmur heard. Pulmonary:     Effort: Pulmonary effort is normal. No respiratory distress.     Breath sounds: Normal breath sounds.   Musculoskeletal:        General: Swelling present.     Right wrist: Normal.     Left wrist: Swelling and tenderness present. Decreased range of motion. Normal pulse.     Right hand: Normal.     Left hand: Swelling and tenderness present. Decreased range of motion. Normal capillary refill.  Skin:    General: Skin is warm and dry.     Capillary Refill: Capillary refill takes less than 2 seconds.     Findings: No erythema, rash or wound.  Neurological:     Mental Status: She is alert.  Psychiatric:        Mood and Affect: Mood normal.      UC Treatments / Results  Labs (all labs ordered are listed, but only abnormal results are displayed) Labs Reviewed - No data to display  EKG   Radiology No results found.  Procedures Procedures (including critical care time)  Medications Ordered in UC Medications - No data to display  Initial Impression / Assessment and Plan / UC Course  I have reviewed the triage vital signs and the nursing notes.  Pertinent labs & imaging results that were available during my care of the patient were reviewed by me and considered in my medical decision making (see chart for details).     Vitals and chart triage reviewed, patient is hemodynamically stable.  Her blood pressure is elevated at 162/72, this is consistent with previous readings.  She reports compliance with antihypertensive medications.  Advised to follow-up with PCP regarding elevated blood pressure readings.  Symptoms consistent with acute gout flare.  She is neurovascularly intact to the left hand.  She has reduced range of motion due to edema.  No erythema.  Low concern for cellulitis.  She is given prednisone  taper pack.  Plan of care, follow-up care, return precautions given, no questions at this time. Final Clinical Impressions(s) / UC Diagnoses   Final diagnoses:  Hand swelling     Discharge Instructions      Likely acute gout flare. Your blood pressure is elevated  today.  You have been prescribed prednisone  taper pack.  Please take as directed.  You can take Tylenol /acetaminophen  and/or Advil /ibuprofen  for pain. Apply ice to the area.  On for 15-20 minutes 2-3 times daily.  If you develop any new or worsening symptoms or if your symptoms do not start to improve, please follow-up here or with your primary care physician.  If your symptoms are severe, please go to the emergency room.      ED Prescriptions     Medication Sig Dispense Auth. Provider   predniSONE  (STERAPRED UNI-PAK 21 TAB) 10 MG (21) TBPK tablet Take by mouth daily. Take 4 tabs by mouth daily  for 2 days, then 3 tabs for 2 days, then 2 tabs for 2 days, then 1 tab for 2 days, 0.5 tab for 2 days. 21 tablet Lashunda Greis C, FNP      PDMP not reviewed this encounter.    [1]  Social History Tobacco Use   Smoking status: Never   Smokeless tobacco: Never  Vaping Use   Vaping status: Never Used  Substance Use Topics   Alcohol use: No   Drug use: No     Lennice Jon BROCKS, FNP 10/31/24 1153

## 2024-11-04 ENCOUNTER — Ambulatory Visit
Admission: RE | Admit: 2024-11-04 | Discharge: 2024-11-04 | Disposition: A | Source: Ambulatory Visit | Attending: Nurse Practitioner

## 2024-11-04 DIAGNOSIS — Z1231 Encounter for screening mammogram for malignant neoplasm of breast: Secondary | ICD-10-CM

## 2024-11-11 ENCOUNTER — Ambulatory Visit: Admitting: Primary Care

## 2024-11-11 ENCOUNTER — Encounter: Payer: Self-pay | Admitting: Primary Care

## 2024-11-11 VITALS — BP 134/80 | HR 102 | Temp 98.4°F | Ht 59.5 in | Wt 149.1 lb

## 2024-11-11 DIAGNOSIS — M109 Gout, unspecified: Secondary | ICD-10-CM | POA: Diagnosis not present

## 2024-11-11 DIAGNOSIS — L723 Sebaceous cyst: Secondary | ICD-10-CM | POA: Diagnosis not present

## 2024-11-11 MED ORDER — PREDNISONE 20 MG PO TABS: ORAL_TABLET | ORAL | 0 refills | Status: AC

## 2024-11-11 NOTE — Progress Notes (Signed)
 "  Subjective:    Patient ID: Anita Ramirez, female    DOB: September 03, 1961, 63 y.o.   MRN: [id number]  Anita Ramirez is a very pleasant 63 y.o. female patient of Matt, NP with a history of hand dermatitis, hand swelling, chronic gout, hypertension who presents today to discuss gout flare and skin cyst.  Symptom onset about 1 week ago with left wrist and hand swelling. Evaluated by urgent care on 10/31/24 and was prescribed prednisone  pack. Since then she's noticed a reduction in swelling and pain, but no resolve.  Evaluated by Dr. Avelina in May 2025, diagnosed with gout with uric acid of 13.1.  No follow-up uric acid on file. She has chronic joint pain to the hips, otherwise does not believe she has gout attacks elsewhere.  She denies trauma/injury, erythema.  She has noticed warmth to the site.  She has a chronic cyst to the left medial clavicle for which has been present for years. Over the last 1 year the cyst has increased in size.  She denies pain/irritation, but cosmetically the cyst is bothersome.  She does not see dermatology.   Review of Systems  Constitutional:  Negative for fever.  Musculoskeletal:  Positive for arthralgias and joint swelling.  Skin:  Negative for color change.       Skin mass         Past Medical History:  Diagnosis Date   Anemia    History of chicken pox    Hyperlipemia    Hypertension    Obesity     Social History   Socioeconomic History   Marital status: Single    Spouse name: Not on file   Number of children: 0   Years of education: 12   Highest education level: Not on file  Occupational History   Not on file  Tobacco Use   Smoking status: Never   Smokeless tobacco: Never  Vaping Use   Vaping status: Never Used  Substance and Sexual Activity   Alcohol use: No   Drug use: No   Sexual activity: Not Currently    Birth control/protection: Post-menopausal  Other Topics Concern   Not on file  Social History Narrative   Regular  exercise-yes   Caffeine Use-no   Social Drivers of Health   Tobacco Use: Low Risk (11/11/2024)   Patient History    Smoking Tobacco Use: Never    Smokeless Tobacco Use: Never    Passive Exposure: Not on file  Financial Resource Strain: Not on file  Food Insecurity: No Food Insecurity (11/06/2023)   Hunger Vital Sign    Worried About Running Out of Food in the Last Year: Never true    Ran Out of Food in the Last Year: Never true  Transportation Needs: No Transportation Needs (11/06/2023)   PRAPARE - Administrator, Civil Service (Medical): No    Lack of Transportation (Non-Medical): No  Physical Activity: Not on file  Stress: Not on file  Social Connections: Not on file  Intimate Partner Violence: Not on file  Depression (PHQ2-9): Low Risk (05/20/2024)   Depression (PHQ2-9)    PHQ-2 Score: 0  Alcohol Screen: Not on file  Housing: Not on file  Utilities: Not on file  Health Literacy: Not on file    Past Surgical History:  Procedure Laterality Date   BREAST BIOPSY Right 11/03/2022   US  RT BREAST BX W LOC DEV 1ST LESION IMG BX SPEC US  GUIDE 11/03/2022 [facility]  COLONOSCOPY     no prior surgery      Family History  Problem Relation Age of Onset   Breast cancer Mother 22 - 55   Hypertension Mother    Pancreatic cancer Maternal Uncle    Cancer Maternal Grandmother 59       breast   Prostate cancer Maternal Grandfather    Breast cancer Other    Early death Neg Hx    Stroke Neg Hx    Colon cancer Neg Hx    Colon polyps Neg Hx    Esophageal cancer Neg Hx    Rectal cancer Neg Hx    Stomach cancer Neg Hx     Allergies[1]  Medications Ordered Prior to Encounter[2]  BP 134/80   Pulse (!) 102   Temp 98.4 F (36.9 C) (Oral)   Ht 4' 11.5 (1.511 m)   Wt 149 lb 2 oz (67.6 kg)   LMP 08/13/2014   SpO2 98%   BMI 29.62 kg/m  Objective:   Physical Exam Constitutional:      General: She is not in acute distress. Cardiovascular:     Rate and  Rhythm: Normal rate.  Pulmonary:     Effort: Pulmonary effort is normal.  Musculoskeletal:     Left wrist: Swelling and tenderness present. Decreased range of motion. Normal pulse.     Comments: Warmth to the site.  Skin:    General: Skin is warm and dry.     Findings: No erythema.     Comments: 2 cm subcutaneous, soft, mobile mass to left medial clavicle.  Nontender.  No erythema.  Neurological:     Mental Status: She is alert.     Physical Exam        Assessment & Plan:  Acute gout of left wrist, unspecified cause Assessment & Plan: HPI and exam today representative.  Reviewed urgent care notes.  Do suspect that she needs a longer course of treatment. Colchicine may not be as effective given duration of symptoms.  Start prednisone  20 mg tablets. Take 3 tablets by mouth every morning for 3 days, then 2 tablets for 3 days, then 1 tablet for 3 days. She does not have a history of diabetes.  We discussed common causes for gout, handout regarding low purine diet provided. We also discussed daily preventative treatment if she continues to experience recurrent flares. Follow-up with PCP regarding recent flare and uric acid levels once flare resolves.  Consider discontinuation of hydrochlorothiazide .   Orders: -     predniSONE ; Take 3 tablets by mouth daily x 3 days, then 2 tablets x 3 days, then 1 tablet for 3 days.  Dispense: 18 tablet; Refill: 0  Sebaceous cyst Assessment & Plan: Exam today representative.  Discussed options as cosmetically she is requesting intervention. Referral placed to dermatology for further evaluation.   Orders: -     Ambulatory referral to Dermatology    Assessment and Plan Assessment & Plan         Comer MARLA Gaskins, NP       [1] No Known Allergies [2]  Current Outpatient Medications on File Prior to Visit  Medication Sig Dispense Refill   amLODipine  (NORVASC ) 5 MG tablet TAKE 1 TABLET(5 MG) BY MOUTH DAILY 30 tablet 0    lisinopril -hydrochlorothiazide  (ZESTORETIC ) 20-25 MG tablet TAKE 1 TABLET BY MOUTH EVERY DAY 90 tablet 1   simvastatin  (ZOCOR ) 20 MG tablet Take 1 tablet (20 mg total) by mouth at bedtime. 90 tablet 3  tiZANidine  (ZANAFLEX ) 4 MG tablet Take 0.5-1 tablets (2-4 mg total) by mouth every 8 (eight) hours as needed for muscle spasms. 20 tablet 0   traMADol  (ULTRAM ) 50 MG tablet Take 1 tablet (50 mg total) by mouth every 6 (six) hours as needed. 10 tablet 0   Vitamin D , Ergocalciferol , (DRISDOL ) 1.25 MG (50000 UNIT) CAPS capsule Take 1 capsule (50,000 Units total) by mouth every 7 (seven) days. 12 capsule 0   No current facility-administered medications on file prior to visit.   "

## 2024-11-11 NOTE — Patient Instructions (Addendum)
 Start prednisone  20 mg tablets. Take 3 tablets by mouth every morning for 3 days, then 2 tablets for 3 days, then 1 tablet for 3 days.  You will receive a phone call regarding the referral to dermatology  Please schedule a follow-up visit with [facility] for 2 weeks for reevaluation of your gout flare.  It was a pleasure meeting you!

## 2024-11-11 NOTE — Assessment & Plan Note (Signed)
 Exam today representative.  Discussed options as cosmetically she is requesting intervention. Referral placed to dermatology for further evaluation.

## 2024-11-11 NOTE — Assessment & Plan Note (Addendum)
 HPI and exam today representative.  Reviewed urgent care notes.  Do suspect that she needs a longer course of treatment. Colchicine may not be as effective given duration of symptoms.  Start prednisone  20 mg tablets. Take 3 tablets by mouth every morning for 3 days, then 2 tablets for 3 days, then 1 tablet for 3 days. She does not have a history of diabetes.  We discussed common causes for gout, handout regarding low purine diet provided. We also discussed daily preventative treatment if she continues to experience recurrent flares. Follow-up with PCP regarding recent flare and uric acid levels once flare resolves.  Consider discontinuation of hydrochlorothiazide .

## 2024-12-02 ENCOUNTER — Ambulatory Visit: Admitting: Nurse Practitioner

## 2024-12-02 VITALS — BP 120/70 | HR 86 | Temp 98.0°F | Ht 59.5 in | Wt 145.8 lb

## 2024-12-02 DIAGNOSIS — E559 Vitamin D deficiency, unspecified: Secondary | ICD-10-CM | POA: Diagnosis not present

## 2024-12-02 DIAGNOSIS — E785 Hyperlipidemia, unspecified: Secondary | ICD-10-CM | POA: Diagnosis not present

## 2024-12-02 DIAGNOSIS — Z8739 Personal history of other diseases of the musculoskeletal system and connective tissue: Secondary | ICD-10-CM | POA: Diagnosis not present

## 2024-12-02 DIAGNOSIS — I10 Essential (primary) hypertension: Secondary | ICD-10-CM | POA: Diagnosis not present

## 2024-12-02 DIAGNOSIS — E790 Hyperuricemia without signs of inflammatory arthritis and tophaceous disease: Secondary | ICD-10-CM | POA: Diagnosis not present

## 2024-12-02 DIAGNOSIS — Z131 Encounter for screening for diabetes mellitus: Secondary | ICD-10-CM | POA: Diagnosis not present

## 2024-12-02 LAB — VITAMIN D 25 HYDROXY (VIT D DEFICIENCY, FRACTURES): VITD: 34.16 ng/mL (ref 30.00–100.00)

## 2024-12-02 LAB — CBC
HCT: 26.8 % — ABNORMAL LOW (ref 36.0–46.0)
Hemoglobin: 8.5 g/dL — ABNORMAL LOW (ref 12.0–15.0)
MCHC: 31.8 g/dL (ref 30.0–36.0)
MCV: 83.3 fl (ref 78.0–100.0)
Platelets: 219 K/uL (ref 150.0–400.0)
RBC: 3.22 Mil/uL — ABNORMAL LOW (ref 3.87–5.11)
RDW: 14.8 % (ref 11.5–15.5)
WBC: 5.1 K/uL (ref 4.0–10.5)

## 2024-12-02 LAB — LIPID PANEL
Cholesterol: 246 mg/dL — ABNORMAL HIGH (ref 28–200)
HDL: 67.8 mg/dL
LDL Cholesterol: 159 mg/dL — ABNORMAL HIGH (ref 10–99)
NonHDL: 177.95
Total CHOL/HDL Ratio: 4
Triglycerides: 95 mg/dL (ref 10.0–149.0)
VLDL: 19 mg/dL (ref 0.0–40.0)

## 2024-12-02 LAB — COMPREHENSIVE METABOLIC PANEL WITH GFR
ALT: 16 U/L (ref 3–35)
AST: 12 U/L (ref 5–37)
Albumin: 4.2 g/dL (ref 3.5–5.2)
Alkaline Phosphatase: 52 U/L (ref 39–117)
BUN: 40 mg/dL — ABNORMAL HIGH (ref 6–23)
CO2: 28 meq/L (ref 19–32)
Calcium: 9.7 mg/dL (ref 8.4–10.5)
Chloride: 107 meq/L (ref 96–112)
Creatinine, Ser: 1.39 mg/dL — ABNORMAL HIGH (ref 0.40–1.20)
GFR: 40.45 mL/min — ABNORMAL LOW
Glucose, Bld: 103 mg/dL — ABNORMAL HIGH (ref 70–99)
Potassium: 3.8 meq/L (ref 3.5–5.1)
Sodium: 143 meq/L (ref 135–145)
Total Bilirubin: 0.4 mg/dL (ref 0.2–1.2)
Total Protein: 6.9 g/dL (ref 6.0–8.3)

## 2024-12-02 LAB — URIC ACID: Uric Acid, Serum: 11.3 mg/dL — ABNORMAL HIGH (ref 2.4–7.0)

## 2024-12-02 LAB — TSH: TSH: 3.26 u[IU]/mL (ref 0.35–5.50)

## 2024-12-02 LAB — HEMOGLOBIN A1C: Hgb A1c MFr Bld: 5.5 % (ref 4.6–6.5)

## 2024-12-02 NOTE — Patient Instructions (Signed)
 Nice to see you today I will be in touch  with the labs once I have them  Follow up with me in approx 2 months for your physical, sooner if you need me

## 2024-12-02 NOTE — Progress Notes (Signed)
 "  Established Patient Office Visit  Subjective   Patient ID: Anita Ramirez, female    DOB: Apr 11, 1961  Age: 65 y.o. MRN: [id number]  Chief Complaint  Patient presents with   Follow-up    Pt states gout flare up is doing well.     HPI  Discussed the use of AI scribe software for clinical note transcription with the patient, who gave verbal consent to proceed.  History of Present Illness Anita Ramirez is a 64 year old female with hypertension who presents for follow-up of hand pain and swelling.  She initially experienced hand pain and swelling, which was suspected to be gout. She was treated with a longer course of prednisone . She reports that her symptoms are better now compared to a couple of weeks ago.  Currently, there is no pain, numbness, tingling, or weakness in the affected hand, but there is slight puffiness on the side of the hand. 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 This is her first occurrence of gout, and her uric acid levels were noted to be high in May of the previous year.  No fever, chills, chest pain, or shortness of breath.     Review of Systems  Constitutional:  Negative for chills and fever.  Respiratory:  Negative for shortness of breath.   Cardiovascular:  Negative for chest pain.  Musculoskeletal:  Negative for joint pain.  Neurological:  Negative for headaches.      Objective:     BP 120/70   Pulse 86   Temp 98 F (36.7 C) (Oral)   Ht 4' 11.5 (1.511 m)   Wt 145 lb 12.8 oz (66.1 kg)   LMP 08/13/2014   SpO2 99%   BMI 28.96 kg/m    Physical Exam Vitals and nursing note reviewed.  Constitutional:      Appearance: Normal appearance.  Cardiovascular:     Rate and Rhythm: Normal rate and regular rhythm.     Pulses:          Radial pulses are 2+ on the right side and 2+ on the  left side.     Heart sounds: Normal heart sounds.  Pulmonary:     Effort: Pulmonary effort is normal.     Breath sounds: Normal breath sounds.  Musculoskeletal:       Arms:  Neurological:     Mental Status: She is alert.     Comments: Bilateral upper extremity strength 5/5      No results found for any visits on 12/02/24.    The 10-year ASCVD risk score (Arnett DK, et al., 2019) is: 6.5%    Assessment & Plan:   Problem List Items Addressed This Visit       Cardiovascular and Mediastinum   Primary hypertension   Relevant Orders   CBC   Comprehensive metabolic panel with GFR   Hemoglobin A1c   TSH   Lipid panel     Other   Hyperlipemia   Relevant Orders   Lipid panel   Vitamin D  deficiency   Relevant Orders   VITAMIN D  25 Hydroxy (Vit-D Deficiency, Fractures)   Other Visit Diagnoses       History of gout    -  Primary   Relevant Orders   Uric acid     Screening for diabetes mellitus  Relevant Orders   Hemoglobin A1c     Hyperuricemia       Relevant Orders   Uric acid      Assessment and Plan Assessment & Plan Gout of right hand Gout flare in right hand improved with prednisone . Mild residual swelling. Elevated uric acid levels noted previously. Current antihypertensive medication may increase gout risk. - Apply ice to affected area after work. - Rechecked uric acid level with blood work today. - Discuss potential medication adjustments if gout recurs.  Primary hypertension Hypertension managed with lisinopril -hydrochlorothiazide  and amlodipine . Current medication may increase gout risk, but no changes made as this is the first gout occurrence. - Continue current antihypertensive regimen. - Monitor blood pressure and symptoms.  General health maintenance Overdue for physical examination. Blood work conducted today to assess health status. - Scheduled physical examination in the next couple of months. - Conducted blood work today to assess  current health status.   Return in about 2 months (around 01/30/2025) for CPE.    Adina Crandall, NP  "

## 2024-12-07 ENCOUNTER — Ambulatory Visit: Payer: Self-pay | Admitting: Nurse Practitioner

## 2024-12-07 DIAGNOSIS — N289 Disorder of kidney and ureter, unspecified: Secondary | ICD-10-CM

## 2024-12-07 DIAGNOSIS — R7989 Other specified abnormal findings of blood chemistry: Secondary | ICD-10-CM

## 2024-12-10 ENCOUNTER — Other Ambulatory Visit

## 2024-12-16 ENCOUNTER — Other Ambulatory Visit

## 2024-12-19 ENCOUNTER — Other Ambulatory Visit

## 2024-12-24 ENCOUNTER — Other Ambulatory Visit

## 2025-01-02 ENCOUNTER — Other Ambulatory Visit

## 2025-02-05 ENCOUNTER — Encounter: Admitting: Nurse Practitioner

## 2025-07-21 ENCOUNTER — Ambulatory Visit: Admitting: Physician Assistant
